# Patient Record
Sex: Male | Born: 1935 | Race: White | Hispanic: No | Smoking: Never smoker
Health system: Southern US, Community
[De-identification: ages and names within clinical notes are randomized; demographics above are authoritative.]

## PROBLEM LIST (undated history)

## (undated) DIAGNOSIS — L570 Actinic keratosis: Secondary | ICD-10-CM

## (undated) DIAGNOSIS — N400 Enlarged prostate without lower urinary tract symptoms: Secondary | ICD-10-CM

## (undated) DIAGNOSIS — I350 Nonrheumatic aortic (valve) stenosis: Secondary | ICD-10-CM

## (undated) DIAGNOSIS — H3530 Unspecified macular degeneration: Secondary | ICD-10-CM

## (undated) DIAGNOSIS — I340 Nonrheumatic mitral (valve) insufficiency: Secondary | ICD-10-CM

## (undated) DIAGNOSIS — I5022 Chronic systolic (congestive) heart failure: Secondary | ICD-10-CM

## (undated) DIAGNOSIS — I639 Cerebral infarction, unspecified: Secondary | ICD-10-CM

## (undated) DIAGNOSIS — I2510 Atherosclerotic heart disease of native coronary artery without angina pectoris: Secondary | ICD-10-CM

## (undated) DIAGNOSIS — I071 Rheumatic tricuspid insufficiency: Secondary | ICD-10-CM

## (undated) DIAGNOSIS — N1831 Chronic kidney disease, stage 3a: Secondary | ICD-10-CM

## (undated) DIAGNOSIS — I480 Paroxysmal atrial fibrillation: Secondary | ICD-10-CM

## (undated) DIAGNOSIS — I700 Atherosclerosis of aorta: Secondary | ICD-10-CM

## (undated) HISTORY — DX: Benign prostatic hyperplasia without lower urinary tract symptoms: N40.0

## (undated) HISTORY — DX: Unspecified macular degeneration: H35.30

## (undated) HISTORY — DX: Actinic keratosis: L57.0

## (undated) HISTORY — PX: SHOULDER SURGERY: SHX246

## (undated) HISTORY — DX: Cerebral infarction, unspecified: I63.9

## (undated) HISTORY — PX: KNEE SURGERY: SHX244

---

## 2018-03-16 ENCOUNTER — Ambulatory Visit (INDEPENDENT_AMBULATORY_CARE_PROVIDER_SITE_OTHER): Payer: Medicare Other | Admitting: Internal Medicine

## 2018-03-16 ENCOUNTER — Encounter (INDEPENDENT_AMBULATORY_CARE_PROVIDER_SITE_OTHER): Payer: Self-pay | Admitting: Internal Medicine

## 2018-03-16 VITALS — BP 140/90 | HR 71 | Temp 97.5°F | Ht 66.0 in | Wt 145.0 lb

## 2018-03-16 DIAGNOSIS — D7589 Other specified diseases of blood and blood-forming organs: Secondary | ICD-10-CM

## 2018-03-16 DIAGNOSIS — R030 Elevated blood-pressure reading, without diagnosis of hypertension: Secondary | ICD-10-CM

## 2018-03-16 DIAGNOSIS — Z0000 Encounter for general adult medical examination without abnormal findings: Secondary | ICD-10-CM

## 2018-03-16 DIAGNOSIS — K219 Gastro-esophageal reflux disease without esophagitis: Secondary | ICD-10-CM | POA: Insufficient documentation

## 2018-03-16 DIAGNOSIS — R351 Nocturia: Secondary | ICD-10-CM

## 2018-03-16 LAB — COMPREHENSIVE METABOLIC PANEL
ALT: 20 U/L (ref 0–55)
AST (SGOT): 22 U/L (ref 5–34)
Albumin/Globulin Ratio: 1.3 (ref 0.9–2.2)
Albumin: 3.8 g/dL (ref 3.5–5.0)
Alkaline Phosphatase: 139 U/L — ABNORMAL HIGH (ref 38–106)
BUN: 25 mg/dL (ref 9.0–28.0)
Bilirubin, Total: 0.6 mg/dL (ref 0.2–1.2)
CO2: 30 mEq/L — ABNORMAL HIGH (ref 21–29)
Calcium: 9.4 mg/dL (ref 7.9–10.2)
Chloride: 102 mEq/L (ref 100–111)
Creatinine: 1 mg/dL (ref 0.5–1.5)
Globulin: 3 g/dL (ref 2.0–3.7)
Glucose: 98 mg/dL (ref 70–100)
Potassium: 4.4 mEq/L (ref 3.5–5.1)
Protein, Total: 6.8 g/dL (ref 6.0–8.3)
Sodium: 142 mEq/L (ref 136–145)

## 2018-03-16 LAB — CBC AND DIFFERENTIAL
Absolute NRBC: 0 10*3/uL (ref 0.00–0.00)
Basophils Absolute Automated: 0.01 10*3/uL (ref 0.00–0.08)
Basophils Automated: 0.2 %
Eosinophils Absolute Automated: 0 10*3/uL (ref 0.00–0.44)
Eosinophils Automated: 0 %
Hematocrit: 39.6 % (ref 37.6–49.6)
Hgb: 12.6 g/dL (ref 12.5–17.1)
Immature Granulocytes Absolute: 0.02 10*3/uL (ref 0.00–0.07)
Immature Granulocytes: 0.5 %
Lymphocytes Absolute Automated: 0.91 10*3/uL (ref 0.42–3.22)
Lymphocytes Automated: 20.6 %
MCH: 33.9 pg — ABNORMAL HIGH (ref 25.1–33.5)
MCHC: 31.8 g/dL (ref 31.5–35.8)
MCV: 106.5 fL — ABNORMAL HIGH (ref 78.0–96.0)
MPV: 12.3 fL (ref 8.9–12.5)
Monocytes Absolute Automated: 0.4 10*3/uL (ref 0.21–0.85)
Monocytes: 9.1 %
Neutrophils Absolute: 3.07 10*3/uL (ref 1.10–6.33)
Neutrophils: 69.6 %
Nucleated RBC: 0 /100 WBC (ref 0.0–0.0)
Platelets: 118 10*3/uL — ABNORMAL LOW (ref 142–346)
RBC: 3.72 10*6/uL — ABNORMAL LOW (ref 4.20–5.90)
RDW: 14 % (ref 11–15)
WBC: 4.41 10*3/uL (ref 3.10–9.50)

## 2018-03-16 LAB — HEMOLYSIS INDEX: Hemolysis Index: 7 (ref 0–18)

## 2018-03-16 LAB — LIPID PANEL
Cholesterol / HDL Ratio: 4.3
Cholesterol: 167 mg/dL (ref 0–199)
HDL: 39 mg/dL — ABNORMAL LOW (ref 40–9999)
LDL Calculated: 99 mg/dL (ref 0–99)
Triglycerides: 144 mg/dL (ref 34–149)
VLDL Calculated: 29 mg/dL (ref 10–40)

## 2018-03-16 LAB — GFR: EGFR: >60

## 2018-03-16 MED ORDER — TAMSULOSIN HCL 0.4 MG PO CAPS
0.40 mg | ORAL_CAPSULE | Freq: Every day | ORAL | 1 refills | Status: DC
Start: 2018-03-16 — End: 2018-09-03

## 2018-03-16 NOTE — Progress Notes (Signed)
Subjective:      Patient ID: Mark Edwards is a 84 y.o. male.    Chief Complaint:  Chief Complaint   Patient presents with   . Annual Exam     Fasting for labs    . Referral     Dermatologist       HPI:  HPI     Pt presents for annual exam and to establish care.    Elevated BP- 160/78 today. Pt occasionally monitors home BP, reporting systolic readings in the 130s-140s. No CP or SOB.    GERD- Compliant with OTC Nexium 20 mg. Stable, no dysphagia    Hearing Loss- Does not want hearing aids.     Macular Degeneration- Blurry eyesight with glasses. Been evaluated by specialists, no specific rx recommended. Pt does not want to follow with ophtho anymore.     Pt reports regular exercise.    Nocturia- Wakes 2x per night to urinate. No urgency. +hx prostate surgery 20yrs ago, reportedly for BPH, no hx prostate cancer.     Lives with son, relocated to [place] from [place] 1 yr ago.     Problem List:  Patient Active Problem List   Diagnosis   . Gastroesophageal reflux disease without esophagitis       Current Medications:  Current Outpatient Medications   Medication Sig Dispense Refill   . esomeprazole (NEXIUM) 20 MG capsule Take 20 mg by mouth every morning before breakfast     . fexofenadine (ALLEGRA) 180 MG tablet Take 180 mg by mouth as needed     . tamsulosin (FLOMAX) 0.4 MG Cap Take 1 capsule (0.4 mg total) by mouth Daily after dinner 90 capsule 1     No current facility-administered medications for this visit.        Allergies:  No Known Allergies    Past Medical History:  No past medical history on file.    Past Surgical History:  Past Surgical History:   Procedure Laterality Date   . DENTAL SURGERY     . PROSTATE SURGERY  1980    for BPH,        Family History:  Family History   Problem Relation Age of Onset   . Asthma Father        Social History:  Social History     Socioeconomic History   . Marital status: Widowed     Spouse name: Not on file   . Number of children: 3   . Years of education: Not on file   .  Highest education level: Not on file   Occupational History   . Occupation: retired, Chief Executive Officer Needs   . Financial resource strain: Not on file   . Food insecurity:     Worry: Not on file     Inability: Not on file   . Transportation needs:     Medical: Not on file     Non-medical: Not on file   Tobacco Use   . Smoking status: Never Smoker   . Smokeless tobacco: Never Used   Substance and Sexual Activity   . Alcohol use: Yes     Alcohol/week: 1.0 standard drinks     Types: 1 Glasses of wine per week     Comment: Red Wine    . Drug use: Never   . Sexual activity: Not Currently   Lifestyle   . Physical activity:     Days per week: Not on file  Minutes per session: Not on file   . Stress: Not on file   Relationships   . Social connections:     Talks on phone: Not on file     Gets together: Not on file     Attends religious service: Not on file     Active member of club or organization: Not on file     Attends meetings of clubs or organizations: Not on file     Relationship status: Not on file   . Intimate partner violence:     Fear of current or ex partner: Not on file     Emotionally abused: Not on file     Physically abused: Not on file     Forced sexual activity: Not on file   Other Topics Concern   . Not on file   Social History Narrative   . Not on file       The following sections were reviewed this encounter by the provider:   Tobacco  Allergies  Meds  Problems  Surg Hx  Fam Hx  Soc Hx        ROS:  Review of Systems   Constitutional: Negative for activity change, appetite change, fever and unexpected weight change.   Eyes: Negative for visual disturbance.   Respiratory: Negative for shortness of breath.    Cardiovascular: Negative for chest pain and palpitations.   Gastrointestinal: Negative for abdominal pain, constipation, diarrhea, nausea and vomiting.   Genitourinary: Negative for difficulty urinating and urgency.        Nocturia   Musculoskeletal: Negative for myalgias.    Neurological: Negative for syncope, weakness, light-headedness and headaches.   Psychiatric/Behavioral: Negative for sleep disturbance.   All other systems reviewed and are negative.      Vitals:  BP 140/90   Pulse 71   Temp 97.5 F (36.4 C) (Oral)   Ht 1.676 m (5\' 6" )   Wt 65.8 kg (145 lb)   BMI 23.40 kg/m      Objective:     Physical Exam:  Physical Exam  Vitals signs reviewed.   Constitutional:       General: He is not in acute distress.     Appearance: Normal appearance. He is well-developed and normal weight.   HENT:      Head: Normocephalic.      Right Ear: Tympanic membrane and external ear normal.      Left Ear: Tympanic membrane and external ear normal.      Nose: Nose normal.      Mouth/Throat:      Mouth: Mucous membranes are moist.      Pharynx: Oropharynx is clear. No oropharyngeal exudate.   Eyes:      General: No scleral icterus.     Conjunctiva/sclera: Conjunctivae normal.      Pupils: Pupils are equal, round, and reactive to light.   Neck:      Musculoskeletal: Normal range of motion and neck supple.      Thyroid: No thyroid mass or thyromegaly.      Vascular: No carotid bruit or JVD.   Cardiovascular:      Rate and Rhythm: Normal rate and regular rhythm.      Pulses: Normal pulses.      Heart sounds: Normal heart sounds. No murmur. No friction rub. No gallop.    Pulmonary:      Effort: Pulmonary effort is normal. No respiratory distress.      Breath sounds: Normal breath sounds.  No wheezing, rhonchi or rales.   Abdominal:      General: Bowel sounds are normal. There is no distension.      Palpations: Abdomen is soft. There is no mass.      Tenderness: There is no tenderness. There is no guarding or rebound.      Hernia: There is no hernia in the right inguinal area or left inguinal area.   Genitourinary:     Penis: Normal and uncircumcised.       Scrotum/Testes: Normal.         Right: Mass or tenderness not present.         Left: Mass or tenderness not present.      Prostate: Not tender  and no nodules present.      Rectum: Normal.      Comments: S/p prostate surgery  Musculoskeletal: Normal range of motion.   Lymphadenopathy:      Cervical: No cervical adenopathy.      Upper Body:      Right upper body: No supraclavicular or axillary adenopathy.      Left upper body: No supraclavicular or axillary adenopathy.      Lower Body: No right inguinal adenopathy. No left inguinal adenopathy.   Skin:     General: Skin is warm and dry.      Findings: No rash.   Neurological:      Mental Status: He is alert and oriented to person, place, and time.      Cranial Nerves: No cranial nerve deficit.      Coordination: Coordination normal.      Deep Tendon Reflexes: Reflexes normal.   Psychiatric:         Mood and Affect: Mood normal.         Behavior: Behavior normal.          Assessment:     1. Annual physical exam  - CBC and differential  - Comprehensive metabolic panel  - Lipid panel    2. Gastroesophageal reflux disease without esophagitis    3. Elevated BP without diagnosis of hypertension  - Lipid panel    4. Nocturia  - PSA  - tamsulosin (FLOMAX) 0.4 MG Cap; Take 1 capsule (0.4 mg total) by mouth Daily after dinner  Dispense: 90 capsule; Refill: 1      Plan:     Continue current medications.  Start Flomax 0.4 mg.  Pt declines Influenza, Tdap, Pneumococcal vaccines today.  Blood work ordered. Further recommendations pending test results.   Monitor home BP.  F/u in 6 months.       By signing my name below, I, Gerrit Friends, Scribe, attest that this documentation has been prepared under the direction and in the presence of Myrtie Neither, MD.    I have reviewed the chart and agree that the record accurately reflects my personal performance of the history, physical exam, discussion and plan.    Excell Seltzer, MD  [facility], [facility]  [address]  [place], [place] [address]  [phone]

## 2018-03-16 NOTE — Progress Notes (Deleted)
Subjective:      Patient ID: Mark Edwards is a 84 y.o. male.    Chief Complaint:  No chief complaint on file.      HPI:  HPI     Pt presents for annual exam and to establish care.    Problem List:  There is no problem list on file for this patient.      Current Medications:  No current outpatient medications on file.     No current facility-administered medications for this visit.        Allergies:  Allergies not on file    Past Medical History:  No past medical history on file.    Past Surgical History:  No past surgical history on file.    Family History:  No family history on file.    Social History:  Social History     Socioeconomic History   . Marital status: Not on file     Spouse name: Not on file   . Number of children: Not on file   . Years of education: Not on file   . Highest education level: Not on file   Occupational History   . Not on file   Social Needs   . Financial resource strain: Not on file   . Food insecurity:     Worry: Not on file     Inability: Not on file   . Transportation needs:     Medical: Not on file     Non-medical: Not on file   Tobacco Use   . Smoking status: Not on file   Substance and Sexual Activity   . Alcohol use: Not on file   . Drug use: Not on file   . Sexual activity: Not on file   Lifestyle   . Physical activity:     Days per week: Not on file     Minutes per session: Not on file   . Stress: Not on file   Relationships   . Social connections:     Talks on phone: Not on file     Gets together: Not on file     Attends religious service: Not on file     Active member of club or organization: Not on file     Attends meetings of clubs or organizations: Not on file     Relationship status: Not on file   . Intimate partner violence:     Fear of current or ex partner: Not on file     Emotionally abused: Not on file     Physically abused: Not on file     Forced sexual activity: Not on file   Other Topics Concern   . Not on file   Social History Narrative   . Not on file        The following sections were reviewed this encounter by the provider:        ROS:  Review of Systems   Constitutional: Negative for activity change, appetite change, fever and unexpected weight change.   Eyes: Negative for visual disturbance.   Respiratory: Negative for shortness of breath.    Cardiovascular: Negative for chest pain and palpitations.   Gastrointestinal: Negative for abdominal pain, constipation, diarrhea, nausea and vomiting.   Genitourinary: Negative for difficulty urinating.   Musculoskeletal: Negative for myalgias.   Neurological: Negative for syncope, weakness, light-headedness and headaches.   Psychiatric/Behavioral: Negative for sleep disturbance.   All other systems reviewed and are negative.  Vitals:  There were no vitals taken for this visit.     Objective:     Physical Exam:  Physical Exam  Vitals signs reviewed.   Constitutional:       General: He is not in acute distress.     Appearance: Normal appearance. He is well-developed and normal weight.   HENT:      Head: Normocephalic.      Right Ear: Tympanic membrane and external ear normal.      Left Ear: Tympanic membrane and external ear normal.      Nose: Nose normal.      Mouth/Throat:      Mouth: Mucous membranes are moist.      Pharynx: Oropharynx is clear. No oropharyngeal exudate.   Eyes:      General: No scleral icterus.     Conjunctiva/sclera: Conjunctivae normal.      Pupils: Pupils are equal, round, and reactive to light.   Neck:      Musculoskeletal: Normal range of motion and neck supple.      Thyroid: No thyroid mass or thyromegaly.      Vascular: No carotid bruit or JVD.   Cardiovascular:      Rate and Rhythm: Normal rate and regular rhythm.      Pulses: Normal pulses.      Heart sounds: Normal heart sounds. No murmur. No friction rub. No gallop.    Pulmonary:      Effort: Pulmonary effort is normal. No respiratory distress.      Breath sounds: Normal breath sounds. No wheezing, rhonchi or rales.   Abdominal:       General: Bowel sounds are normal. There is no distension.      Palpations: Abdomen is soft. There is no mass.      Tenderness: There is no tenderness. There is no guarding or rebound.      Hernia: There is no hernia in the right inguinal area or left inguinal area.   Genitourinary:     Penis: Normal and circumcised.       Scrotum/Testes: Normal.         Right: Mass or tenderness not present.         Left: Mass or tenderness not present.      Rectum: Normal.   Musculoskeletal: Normal range of motion.   Lymphadenopathy:      Cervical: No cervical adenopathy.      Upper Body:      Right upper body: No supraclavicular adenopathy.      Left upper body: No supraclavicular adenopathy.      Lower Body: No right inguinal adenopathy. No left inguinal adenopathy.   Skin:     General: Skin is warm and dry.      Findings: No rash.   Neurological:      Mental Status: He is alert and oriented to person, place, and time.      Cranial Nerves: No cranial nerve deficit.      Coordination: Coordination normal.      Deep Tendon Reflexes: Reflexes normal.   Psychiatric:         Mood and Affect: Mood normal.         Behavior: Behavior normal.          Assessment:     There are no diagnoses linked to this encounter.    Plan:     Continue current medications. Refills given.   Blood work ordered. Further recommendations pending test results.  By signing my name below, I, Gerrit Friends, Scribe, attest that this documentation has been prepared under the direction and in the presence of Myrtie Neither, MD.    I have reviewed the chart and agree that the record accurately reflects my personal performance of the history, physical exam, discussion and plan.

## 2018-03-17 ENCOUNTER — Other Ambulatory Visit (INDEPENDENT_AMBULATORY_CARE_PROVIDER_SITE_OTHER): Payer: Self-pay | Admitting: Internal Medicine

## 2018-03-17 DIAGNOSIS — D7589 Other specified diseases of blood and blood-forming organs: Secondary | ICD-10-CM

## 2018-03-17 LAB — PSA: Prostate Specific Antigen, Total: 5.707 ng/mL — ABNORMAL HIGH (ref 0.000–4.000)

## 2018-03-19 LAB — FOLATE: Folate: 18.8 ng/mL

## 2018-03-19 LAB — VITAMIN B12: Vitamin B-12: 283 pg/mL (ref 211–911)

## 2018-03-20 LAB — IFE REVIEW, SERUM

## 2018-03-20 LAB — IMMUNOFIXATION ELECTROPHORESIS

## 2018-03-20 NOTE — Progress Notes (Signed)
PSA level is borderline high - given pt's age, will monitor.   Cholesterol profile HDL is borderline low, rest of profile is good  Platelet count is mildly low, unclear of chronicity, needs old labs to compare  Vitamin B12 is at low end of normal range  Alkaline phosphatase level is mildly high - needs old labs to compare  Rest of labs are normal    Rec:  1. Take otc vitamin B12 supplement every other day  2. Forward copies of previous blood tests for comparison  3.  Continue meds  4. Keep follow up visit

## 2018-04-02 ENCOUNTER — Other Ambulatory Visit (INDEPENDENT_AMBULATORY_CARE_PROVIDER_SITE_OTHER): Payer: Self-pay | Admitting: Internal Medicine

## 2018-04-02 DIAGNOSIS — L918 Other hypertrophic disorders of the skin: Secondary | ICD-10-CM

## 2018-04-02 NOTE — Progress Notes (Signed)
Pt can see derm, Dr. Kingsley Spittle Phone: [phone]

## 2018-09-03 ENCOUNTER — Other Ambulatory Visit (INDEPENDENT_AMBULATORY_CARE_PROVIDER_SITE_OTHER): Payer: Self-pay | Admitting: Internal Medicine

## 2018-09-03 DIAGNOSIS — R351 Nocturia: Secondary | ICD-10-CM

## 2018-09-03 MED ORDER — TAMSULOSIN HCL 0.4 MG PO CAPS
0.40 mg | ORAL_CAPSULE | Freq: Every day | ORAL | 0 refills | Status: DC
Start: 2018-09-03 — End: 2018-11-25

## 2018-11-25 ENCOUNTER — Other Ambulatory Visit (INDEPENDENT_AMBULATORY_CARE_PROVIDER_SITE_OTHER): Payer: Self-pay | Admitting: Internal Medicine

## 2018-11-25 DIAGNOSIS — R351 Nocturia: Secondary | ICD-10-CM

## 2018-11-25 MED ORDER — TAMSULOSIN HCL 0.4 MG PO CAPS
0.40 mg | ORAL_CAPSULE | Freq: Every day | ORAL | 0 refills | Status: AC
Start: 2018-11-25 — End: ?

## 2019-04-26 DIAGNOSIS — K219 Gastro-esophageal reflux disease without esophagitis: Secondary | ICD-10-CM | POA: Insufficient documentation

## 2019-04-26 DIAGNOSIS — Z9109 Other allergy status, other than to drugs and biological substances: Secondary | ICD-10-CM | POA: Insufficient documentation

## 2019-04-26 DIAGNOSIS — H353190 Nonexudative age-related macular degeneration, unspecified eye, stage unspecified: Secondary | ICD-10-CM | POA: Insufficient documentation

## 2019-04-26 DIAGNOSIS — N138 Other obstructive and reflux uropathy: Secondary | ICD-10-CM | POA: Insufficient documentation

## 2019-06-21 ENCOUNTER — Other Ambulatory Visit (INDEPENDENT_AMBULATORY_CARE_PROVIDER_SITE_OTHER): Payer: Self-pay

## 2019-08-14 ENCOUNTER — Ambulatory Visit: Payer: Self-pay

## 2019-08-21 ENCOUNTER — Ambulatory Visit: Payer: Self-pay | Attending: Internal Medicine

## 2019-08-21 DIAGNOSIS — Z23 Encounter for immunization: Secondary | ICD-10-CM

## 2019-08-21 NOTE — Progress Notes (Signed)
   Covid-19 Vaccination Clinic  Name:  Alejandro Moore    MRN: [id number] DOB: 03-31-1920  08/21/2019  Alejandro Moore was observed post Covid-19 immunization for 15 minutes without incident. He was provided with Vaccine Information Sheet and instruction to access the V-Safe system.   Alejandro Moore was instructed to call [phone] with any severe reactions post vaccine: Marland Kitchen Difficulty breathing  . Swelling of face and throat  . A fast heartbeat  . A bad rash all over body  . Dizziness and weakness   Immunizations Administered    Name Date Dose VIS Date Route   Pfizer COVID-19 Vaccine 08/21/2019  5:03 PM 0.3 mL 06/05/2018 Intramuscular   Manufacturer: [place]   Lot: [id number]   [place]: [id number]

## 2019-08-26 ENCOUNTER — Encounter: Payer: Self-pay | Admitting: Emergency Medicine

## 2019-08-26 ENCOUNTER — Emergency Department
Admission: EM | Admit: 2019-08-26 | Discharge: 2019-08-26 | Disposition: A | Discharge status: Home / Self Care | Payer: Medicare Other | Attending: Emergency Medicine | Admitting: Emergency Medicine

## 2019-08-26 ENCOUNTER — Other Ambulatory Visit: Payer: Self-pay

## 2019-08-26 ENCOUNTER — Emergency Department: Payer: Medicare Other

## 2019-08-26 DIAGNOSIS — R0602 Shortness of breath: Secondary | ICD-10-CM | POA: Diagnosis present

## 2019-08-26 DIAGNOSIS — R609 Edema, unspecified: Secondary | ICD-10-CM | POA: Diagnosis not present

## 2019-08-26 LAB — CBC WITH DIFFERENTIAL/PLATELET
Abs Immature Granulocytes: 0.03 10*3/uL (ref 0.00–0.07)
Basophils Absolute: 0 10*3/uL (ref 0.0–0.1)
Basophils Relative: 0 %
Eosinophils Absolute: 0 10*3/uL (ref 0.0–0.5)
Eosinophils Relative: 0 %
HCT: 35.6 % — ABNORMAL LOW (ref 39.0–52.0)
Hemoglobin: 11.6 g/dL — ABNORMAL LOW (ref 13.0–17.0)
Immature Granulocytes: 1 %
Lymphocytes Relative: 5 %
Lymphs Abs: 0.2 10*3/uL — ABNORMAL LOW (ref 0.7–4.0)
MCH: 34.7 pg — ABNORMAL HIGH (ref 26.0–34.0)
MCHC: 32.6 g/dL (ref 30.0–36.0)
MCV: 106.6 fL — ABNORMAL HIGH (ref 80.0–100.0)
Monocytes Absolute: 0.6 10*3/uL (ref 0.1–1.0)
Monocytes Relative: 14 %
Neutro Abs: 3.3 10*3/uL (ref 1.7–7.7)
Neutrophils Relative %: 80 %
Platelets: 104 10*3/uL — ABNORMAL LOW (ref 150–400)
RBC: 3.34 MIL/uL — ABNORMAL LOW (ref 4.22–5.81)
RDW: 14.8 % (ref 11.5–15.5)
WBC: 4.1 10*3/uL (ref 4.0–10.5)
nRBC: 0 % (ref 0.0–0.2)

## 2019-08-26 LAB — COMPREHENSIVE METABOLIC PANEL
ALT: 42 U/L (ref 0–44)
AST: 31 U/L (ref 15–41)
Albumin: 3.4 g/dL — ABNORMAL LOW (ref 3.5–5.0)
Alkaline Phosphatase: 99 U/L (ref 38–126)
Anion gap: 7 (ref 5–15)
BUN: 36 mg/dL — ABNORMAL HIGH (ref 8–23)
CO2: 26 mmol/L (ref 22–32)
Calcium: 8.6 mg/dL — ABNORMAL LOW (ref 8.9–10.3)
Chloride: 105 mmol/L (ref 98–111)
Creatinine, Ser: 1.27 mg/dL — ABNORMAL HIGH (ref 0.61–1.24)
GFR calc Af Amer: 54 mL/min — ABNORMAL LOW (ref >60)
GFR calc non Af Amer: 47 mL/min — ABNORMAL LOW (ref >60)
Glucose, Bld: 120 mg/dL — ABNORMAL HIGH (ref 70–99)
Potassium: 4.4 mmol/L (ref 3.5–5.1)
Sodium: 138 mmol/L (ref 135–145)
Total Bilirubin: 1 mg/dL (ref 0.3–1.2)
Total Protein: 6.2 g/dL — ABNORMAL LOW (ref 6.5–8.1)

## 2019-08-26 LAB — TROPONIN I (HIGH SENSITIVITY)
Troponin I (High Sensitivity): 99 ng/L — ABNORMAL HIGH (ref <18)
Troponin I (High Sensitivity): 106 ng/L (ref <18)

## 2019-08-26 LAB — BRAIN NATRIURETIC PEPTIDE: B Natriuretic Peptide: 1932.2 pg/mL — ABNORMAL HIGH (ref 0.0–100.0)

## 2019-08-26 MED ORDER — ACETAMINOPHEN 325 MG PO TABS
650.0000 mg | ORAL_TABLET | Freq: Once | ORAL | Status: AC | PRN
  Administered 2019-08-26: 650 mg via ORAL
  Filled 2019-08-26: qty 2

## 2019-08-26 MED ORDER — FUROSEMIDE 40 MG PO TABS
20.0000 mg | ORAL_TABLET | Freq: Once | ORAL | Status: AC
  Administered 2019-08-26: 20 mg via ORAL
  Filled 2019-08-26: qty 1

## 2019-08-26 MED ORDER — AMOXICILLIN-POT CLAVULANATE 875-125 MG PO TABS
1.0000 | ORAL_TABLET | Freq: Once | ORAL | Status: AC
  Administered 2019-08-26: 1 via ORAL
  Filled 2019-08-26: qty 1

## 2019-08-26 MED ORDER — ONDANSETRON 4 MG PO TBDP
4.0000 mg | ORAL_TABLET | Freq: Three times a day (TID) | ORAL | 0 refills | Status: DC | PRN
Start: 2019-08-26 — End: 2019-09-06

## 2019-08-26 MED ORDER — AMOXICILLIN-POT CLAVULANATE 875-125 MG PO TABS
1.0000 | ORAL_TABLET | Freq: Two times a day (BID) | ORAL | 0 refills | Status: DC
Start: 2019-08-26 — End: 2020-07-25

## 2019-08-26 NOTE — ED Provider Notes (Signed)
[facility] Emergency Department Provider Note  ____________________________________________  Time seen: Approximately 10:08 PM  I have reviewed the triage vital signs and the nursing notes.   HISTORY  Chief Complaint Shortness of breath   HPI Alejandro Moore is a 86 y.o. male with no significant past medical history who comes to the ED today with concerns about medications he was recently prescribed.  At baseline the patient is healthy and active, tap dances for an hour every day.  5 days ago, he got his first Covid vaccine.  Shortly thereafter he started having nasal congestion, postnasal drip with nonproductive cough, and mild shortness of breath.  He saw his doctor 3 days ago.  They prescribed Flonase and Levaquin.  After picking up the Levaquin, he is hesitant to take it with concerns for connective tissue injury.  He went back to primary care today where they reassured him that it would be fine to take the Levaquin.  They also prescribed him erythromycin ointment for a hordeolum.  He is still hesitant to take the Levaquin tonight, but the primary care clinic is closed, so he comes to the ED to inquire about other options.  Denies fevers chills chest pain exertional symptoms, pleuritic discomfort, belly pain vomiting diarrhea or body aches.  No Covid exposures, is strict about staying home and wearing masks.      History reviewed. No pertinent past medical history.   There are no problems to display for this patient.    Past Surgical History:  Procedure Laterality Date  . SHOULDER SURGERY       Prior to Admission medications   Medication Sig Start Date End Date Taking? Authorizing Provider  amoxicillin-clavulanate (AUGMENTIN) 875-125 MG tablet Take 1 tablet by mouth 2 (two) times daily. 08/26/19   Carrie Mew, MD  ondansetron (ZOFRAN ODT) 4 MG disintegrating tablet Take 1 tablet (4 mg total) by mouth every 8 (eight) hours as needed for  nausea or vomiting. 08/26/19   Carrie Mew, MD     Allergies Patient has no known allergies.   No family history on file.  Social History Social History   Tobacco Use  . Smoking status: Never Smoker  . Smokeless tobacco: Never Used  Substance Use Topics  . Alcohol use: Never  . Drug use: Never    Review of Systems  Constitutional:   No fever or chills.  ENT:   No sore throat. No rhinorrhea. Cardiovascular:   No chest pain or syncope. Respiratory:   Positive shortness of breath and nonproductive cough. Gastrointestinal:   Negative for abdominal pain, vomiting and diarrhea.  Musculoskeletal:   Negative for focal pain or swelling All other systems reviewed and are negative except as documented above in ROS and HPI.  ____________________________________________   PHYSICAL EXAM:  VITAL SIGNS: ED Triage Vitals  Enc Vitals Group     BP 08/26/19 1922 100/64     Pulse Rate 08/26/19 1922 100     Resp 08/26/19 1922 20     Temp 08/26/19 1922 (!) 100.9 F (38.3 C)     Temp Source 08/26/19 1922 Oral     SpO2 08/26/19 1922 94 %     Weight 08/26/19 1929 145 lb (65.8 kg)     Height 08/26/19 1929 5\' 6"  (1.676 m)     Head Circumference --      Peak Flow --      Pain Score 08/26/19 1923 8     Pain Loc --  Pain Edu? --      Excl. in [place]? --     Vital signs reviewed, nursing assessments reviewed.   Constitutional:   Alert and oriented. Non-toxic appearance. Eyes:   Conjunctivae are normal. EOMI. PERRL. ENT      Head:   Normocephalic and atraumatic.      Nose:   Wearing a mask.      Mouth/Throat:   Wearing a mask.      Neck:   No meningismus. Full ROM. Hematological/Lymphatic/Immunilogical:   No cervical lymphadenopathy. Cardiovascular:   RRR. Symmetric bilateral radial and DP pulses.  No murmurs. Cap refill less than 2 seconds. Respiratory:   Normal respiratory effort without tachypnea/retractions.  Bilateral basilar crackles. Gastrointestinal:   Soft and  nontender. Non distended. There is no CVA tenderness.  No rebound, rigidity, or guarding. Musculoskeletal:   Normal range of motion in all extremities. No joint effusions.  No lower extremity tenderness.  1+ pitting edema bilateral lower extremities, symmetric calf circumference.. Neurologic:   Normal speech and language.  Motor grossly intact. No acute focal neurologic deficits are appreciated.  Skin:    Skin is warm, dry and intact. No rash noted.  No petechiae, purpura, or bullae.  ____________________________________________    LABS (pertinent positives/negatives) (all labs ordered are listed, but only abnormal results are displayed) Labs Reviewed  CBC WITH DIFFERENTIAL/PLATELET - Abnormal; Notable for the following components:      Result Value   RBC 3.34 (*)    Hemoglobin 11.6 (*)    HCT 35.6 (*)    MCV 106.6 (*)    MCH 34.7 (*)    Platelets 104 (*)    Lymphs Abs 0.2 (*)    All other components within normal limits  COMPREHENSIVE METABOLIC PANEL - Abnormal; Notable for the following components:   Glucose, Bld 120 (*)    BUN 36 (*)    Creatinine, Ser 1.27 (*)    Calcium 8.6 (*)    Total Protein 6.2 (*)    Albumin 3.4 (*)    GFR calc non Af Amer 47 (*)    GFR calc Af Amer 54 (*)    All other components within normal limits  BRAIN NATRIURETIC PEPTIDE - Abnormal; Notable for the following components:   B Natriuretic Peptide 1,932.2 (*)    All other components within normal limits  TROPONIN I (HIGH SENSITIVITY) - Abnormal; Notable for the following components:   Troponin I (High Sensitivity) 99 (*)    All other components within normal limits  TROPONIN I (HIGH SENSITIVITY) - Abnormal; Notable for the following components:   Troponin I (High Sensitivity) 106 (*)    All other components within normal limits   ____________________________________________   EKG  Interpreted by me Sinus rhythm rate of 100.  Normal axis and intervals.  Normal QRS and ST segments.   Unremarkable T waves.  2 PVCs on the strip.  No acute ischemic changes. ____________________________________________    RADIOLOGY  DG Chest 2 View  Result Date: 08/26/2019 CLINICAL DATA:  Cough, chest pain, shortness of breath EXAM: CHEST - 2 VIEW COMPARISON:  None. FINDINGS: Small bilateral pleural effusions. Bibasilar atelectasis or scarring. Heart is normal size. Aortic atherosclerosis. No acute bony abnormality. IMPRESSION: Small bilateral pleural effusions with bibasilar atelectasis or scarring. Electronically Signed   By: Rolm Baptise M.D.   On: 08/26/2019 20:17    ____________________________________________   PROCEDURES Procedures  ____________________________________________  DIFFERENTIAL DIAGNOSIS   Vaccine side effect, allergies, non-STEMI, congestive heart failure,  community-acquired pneumonia  CLINICAL IMPRESSION / ASSESSMENT AND PLAN / ED COURSE  Medications ordered in the ED: Medications  acetaminophen (TYLENOL) tablet 650 mg (650 mg Oral Given 08/26/19 1945)  amoxicillin-clavulanate (AUGMENTIN) 875-125 MG per tablet 1 tablet (1 tablet Oral Given 08/26/19 2220)  furosemide (LASIX) tablet 20 mg (20 mg Oral Given 08/26/19 2254)    Pertinent labs & imaging results that were available during my care of the patient were reviewed by me and considered in my medical decision making (see chart for details).  Alejandro Moore was evaluated in Emergency Department on 08/26/2019 for the symptoms described in the history of present illness. He was evaluated in the context of the global COVID-19 pandemic, which necessitated consideration that the patient might be at risk for infection with the SARS-CoV-2 virus that causes COVID-19. Institutional protocols and algorithms that pertain to the evaluation of patients at risk for COVID-19 are in a state of rapid change based on information released by regulatory bodies including the CDC and federal and state organizations. These policies  and algorithms were followed during the patient's care in the ED.     Clinical Course as of Aug 26 2254  Mon Aug 26, 2019  2121 Labs show baseline creatinine and hb from Jan. 2021. Lymphocytopenia appears new. No prior troponin available for comparison.    [PS]  2147 Patient comes to the ED essentially for medication change.  He feels only mildly short of breath, denies chest pain or exertional symptoms.  Had a first Covid vaccine 5 days ago.  Would not of come to the ED today except the the outpatient clinic prescribed him Levaquin and they are worried about side effects and wanted to, after hours to change to a different antibiotic.  He is nontoxic and not septic.   [PS]  2252 Delta troponin okay, but with his troponins of about 100 and his BNP of 1900, worrisome that his shortness of breath is actually due to new onset of heart failure.  He does have 1+ pitting edema bilateral lower extremities.  Focused bedside point-of-care ultrasound performed by me shows no pericardial effusion, EF grossly reduced, probably about 40%.  He is not hypoxic and overall feeling fine.  Offered hospitalization for further cardiac work-up and cardiology consultation, which she declines.  I will refer to heart failure clinic and cardiology.  I will give a single dose of Lasix for now.   [PS]    Clinical Course User Index [PS] Carrie Mew, MD     ____________________________________________   FINAL CLINICAL IMPRESSION(S) / ED DIAGNOSES    Final diagnoses:  Shortness of breath  Peripheral edema     ED Discharge Orders         Ordered    amoxicillin-clavulanate (AUGMENTIN) 875-125 MG tablet  2 times daily     08/26/19 2133    ondansetron (ZOFRAN ODT) 4 MG disintegrating tablet  Every 8 hours PRN     08/26/19 2133          Portions of this note were generated with dragon dictation software. Dictation errors may occur despite best attempts at proofreading.   Carrie Mew,  MD 08/26/19 2256

## 2019-08-26 NOTE — ED Triage Notes (Signed)
Pt presents to ED with concerns about a medicaiton he was prescribed today  Pt states he was seen by his pcp today for chest and back discomfort and he was told he had fluid in his left lung and was started on a medication to get rid of that fluid. Pt states after reading the side effects he decided not to take it. Pt reports it said people over a certain age shouldn't use it so he just wanted to make sure it was safe. Pt reports he has been experiencing right sided back and mid sternal chest pain since yesterday. "some" shortness of breath but denies cough. Pt alert and calm with no acute distress noted at this time.

## 2019-08-27 ENCOUNTER — Telehealth: Payer: Self-pay | Admitting: Family

## 2019-08-27 NOTE — Telephone Encounter (Signed)
Called and LVM to attempt to schedule a new patient appointment after we receieved a referral from the ED.   Alyse Low, [place]

## 2019-08-29 NOTE — Progress Notes (Signed)
Patient ID: Alejandro Moore, male    DOB: 06-Nov-1919, 86 y.o.   MRN: [id number]  HPI  Alejandro Moore is a 86 y/o male with no pertinent medical history.  No echo to review.  Was in the ED 08/26/19 due to wanting antibiotic changed from levaquin to something else. EDP noted patient had shortness of breath and peripheral edema along with elevated BNP. Point of care ultrasound done which showed no effusion and EF of around 40%. Patient feels well and declines admission. He was given a dose of lasix and released.   He presents today for his initial visit with a chief complaint of minimal shortness of breath upon moderate exertion. He describes this as having been present for several weeks and may be "a little" better since he's been in the ED. He has associated fatigue, productive cough and difficulty sleeping along with this. He denies any dizziness, abdominal distention, palpitations, pedal edema or chest pain. He also has some pain around his rib cage when he coughs.   He has been quite active and tap dances often.   No past medical history on file. Past Surgical History:  Procedure Laterality Date  . SHOULDER SURGERY     No family history on file. Social History   Tobacco Use  . Smoking status: Never Smoker  . Smokeless tobacco: Never Used  Substance Use Topics  . Alcohol use: Never   No Known Allergies   Prior to Admission medications   Medication Sig Start Date End Date Taking? Authorizing Provider  acetaminophen (TYLENOL) 500 MG tablet Take 500 mg by mouth every 6 (six) hours as needed.   Yes [provider]  amoxicillin-clavulanate (AUGMENTIN) 875-125 MG tablet Take 1 tablet by mouth 2 (two) times daily. 08/26/19  Yes Carrie Mew, MD  esomeprazole (NEXIUM) 20 MG capsule Take 20 mg by mouth daily.   Yes [provider]  fexofenadine (ALLEGRA) 60 MG tablet Take 180 mg by mouth daily.   Yes [provider]  fluticasone (FLONASE) 50 MCG/ACT nasal  spray Place 2 sprays into both nostrils daily.   Yes [provider]  gentamicin ointment (GARAMYCIN) 0.1 % Apply 1 application topically 3 (three) times daily. 08/26/19 09/05/19 Yes [provider]  Multiple Vitamins-Minerals (ICAPS AREDS 2 PO) Take 1 tablet by mouth 2 (two) times daily.   Yes [provider]  Multiple Vitamins-Minerals (THERAPEUTIC MULTIVIT/MINERAL PO) Take 1 tablet by mouth daily.   Yes [provider]  tamsulosin (FLOMAX) 0.4 MG CAPS capsule Take 0.4 mg by mouth daily. 04/26/19  Yes [provider]  ondansetron (ZOFRAN ODT) 4 MG disintegrating tablet Take 1 tablet (4 mg total) by mouth every 8 (eight) hours as needed for nausea or vomiting. Patient not taking: Reported on 08/30/2019 08/26/19   Carrie Mew, MD     Review of Systems  Constitutional: Positive for fatigue. Negative for appetite change.  HENT: Negative for congestion, postnasal drip and sore throat.   Eyes: Negative.   Respiratory: Positive for cough (productive) and shortness of breath.   Cardiovascular: Positive for leg swelling. Negative for chest pain and palpitations.  Gastrointestinal: Negative for abdominal distention and abdominal pain.  Endocrine: Negative.   Genitourinary: Negative.   Musculoskeletal: Positive for arthralgias (when deep breathing). Negative for neck pain.  Allergic/Immunologic: Negative.   Neurological: Negative for dizziness and light-headedness.  Hematological: Negative for adenopathy. Does not bruise/bleed easily.  Psychiatric/Behavioral: Positive for sleep disturbance (didn't sleep well last 2 nights). Negative for dysphoric mood.  The patient is not nervous/anxious.     Vitals:   08/30/19 1202  BP: 123/74  Pulse: 87  Resp: 18  SpO2: 98%  Weight: 137 lb (62.1 kg)  Height: 5\' 6"  (1.676 m)   Wt Readings from Last 3 Encounters:  08/30/19 137 lb (62.1 kg)  08/26/19 145 lb (65.8 kg)   Lab Results  Component Value Date    CREATININE 1.27 (H) 08/26/2019    Physical Exam Vitals and nursing note reviewed.  Constitutional:      Appearance: He is well-developed.  HENT:     Head: Normocephalic and atraumatic.  Neck:     Vascular: No JVD.  Cardiovascular:     Rate and Rhythm: Normal rate and regular rhythm.  Pulmonary:     Effort: Pulmonary effort is normal. No respiratory distress.     Breath sounds: No wheezing or rales.  Abdominal:     Palpations: Abdomen is soft.     Tenderness: There is no abdominal tenderness.  Musculoskeletal:     Cervical back: Neck supple.     Right lower leg: No tenderness. Edema (1+ pitting) present.     Left lower leg: No tenderness. Edema (1+ pitting) present.  Skin:    General: Skin is warm and dry.  Neurological:     General: No focal deficit present.     Mental Status: He is alert and oriented to person, place, and time.  Psychiatric:        Mood and Affect: Mood normal.        Behavior: Behavior normal.     Assessment & Plan:  1: Heart failure with unknown ejection fraction- - NYHA II - euvolemic today - not weighing daily and daughter says that she will get him a scale; instructed to weigh every morning, write the weight down and call for an overnight weight gain of >2 pounds or a weekly weight gain of >5 pounds - not adding salt and says that he tries to eat low sodium in general; written dietary information and a low sodium cookbook were given to him - is quite active with tap dancing - need to order echocardiogram; discussed ordering it or making appointment with cardiology & let them order it - he would like cardiology appt and since his PCP is at [facility], he prefers to go there; appointment was scheduled for 09/13/19 with first available provider - will begin furosemide 20mg  daily and potassium 74meq daily; cardiology can check BMP - BNP 08/26/19 was 1932.2 - patient has macular degeneration making it difficult for him to see the edema; showed him how to press on  his legs and then feel for the indent - discussed getting compression socks and wearing them daily along with elevating his legs when sitting for long periods of time - PharmD reconciled medications with the patient and his daughter - has received both COVID vaccines  2: GERD- - saw PCP Deloria Lair) 08/26/19 and sees PCP ([facility]) 10/28/19 - BMP reviewed and showed sodium 138, potassium 4.4, creatinine 1.27 & GFR 47   Medication bottles were reviewed.   Patient opts to not make a return appointment at this time and prefers to see cardiologist first. Advised patient and his daughter that he could call back at anytime to make another appointment.

## 2019-08-30 ENCOUNTER — Ambulatory Visit: Payer: Medicare Other | Attending: Family | Admitting: Family

## 2019-08-30 ENCOUNTER — Encounter: Payer: Self-pay | Admitting: Family

## 2019-08-30 ENCOUNTER — Other Ambulatory Visit: Payer: Self-pay

## 2019-08-30 VITALS — BP 123/74 | HR 87 | Resp 18 | Ht 66.0 in | Wt 137.0 lb

## 2019-08-30 DIAGNOSIS — G479 Sleep disorder, unspecified: Secondary | ICD-10-CM | POA: Diagnosis not present

## 2019-08-30 DIAGNOSIS — H3530 Unspecified macular degeneration: Secondary | ICD-10-CM | POA: Diagnosis not present

## 2019-08-30 DIAGNOSIS — Z79899 Other long term (current) drug therapy: Secondary | ICD-10-CM | POA: Diagnosis not present

## 2019-08-30 DIAGNOSIS — K219 Gastro-esophageal reflux disease without esophagitis: Secondary | ICD-10-CM | POA: Diagnosis not present

## 2019-08-30 DIAGNOSIS — I509 Heart failure, unspecified: Secondary | ICD-10-CM | POA: Insufficient documentation

## 2019-08-30 MED ORDER — FUROSEMIDE 20 MG PO TABS
20.0000 mg | ORAL_TABLET | Freq: Every day | ORAL | 3 refills | Status: DC
Start: 2019-08-30 — End: 2019-11-24

## 2019-08-30 MED ORDER — POTASSIUM CHLORIDE ER 10 MEQ PO TBCR
10.0000 meq | EXTENDED_RELEASE_TABLET | Freq: Every day | ORAL | 3 refills | Status: DC
Start: 2019-08-30 — End: 2019-11-24

## 2019-08-30 NOTE — Progress Notes (Signed)
[facility] - PHARMACIST COUNSELING NOTE  ADHERENCE ASSESSMENT  Adherence strategy: Patient accompanied by daughter who helps with medications. Medication bottles brought to visit today.   Do you ever forget to take your medication? [] Yes (1) [x] No (0)  Do you ever skip doses due to side effects? [] Yes (1) [x] No (0)  Do you have trouble affording your medicines? [] Yes (1) [x] No (0)  Are you ever unable to pick up your medication due to transportation difficulties? [] Yes (1) [x] No (0)  Do you ever stop taking your medications because you don't believe they are helping? [] Yes (1) [x] No (0)  Total score 0   Recommendations given to patient about increasing adherence: None  Guideline-Directed Medical Therapy/Evidence Based Medicine  ACE/ARB/ARNI: None Beta Blocker: None Aldosterone Antagonist: None Diuretic: None    SUBJECTIVE  HPI: Patient is a 86 y/o M with no significant PMH who presents to CHF clinic for new patient evaluation. He was recently seen in the ED on 5/17 for shortness of breath. Incidentally found to have a BNP of 1932 at that time with troponin 106. He was given furosemide 20 mg PO x 1.  No past medical history on file.     OBJECTIVE   Vital signs: HR 87, BP 123/74, weight (pounds) 137 ECHO: Future  BMP Latest Ref Rng & Units 08/26/2019  Glucose 70 - 99 mg/dL 120(H)  BUN 8 - 23 mg/dL 36(H)  Creatinine 0.61 - 1.24 mg/dL 1.27(H)  Sodium 135 - 145 mmol/L 138  Potassium 3.5 - 5.1 mmol/L 4.4  Chloride 98 - 111 mmol/L 105  CO2 22 - 32 mmol/L 26  Calcium 8.9 - 10.3 mg/dL 8.6(L)    ASSESSMENT  Patient is a pleasant 86 y/o M who is accompanied by daughter today. Daughter is closely involved in patient's care. Patient endorses shortness of breath with moderate exertion. Edema present in bilateral lower extremities. Patient also reports pain associated with deep breaths which interferes with sleep.  PLAN  1).  Possible CHF -BNP 1932 on 08/26/19 -Not on any medications at home -Discussed with provider - start furosemide 20 mg daily + potassium 10 mEq daily  2). Anemia -Hgb 11.6, MCV 106 on 08/26/19   Time spent: 15 minutes  [facility] Resident 08/30/2019 9:55 AM    Current Outpatient Medications:  .  amoxicillin-clavulanate (AUGMENTIN) 875-125 MG tablet, Take 1 tablet by mouth 2 (two) times daily., Disp: 20 tablet, Rfl: 0 .  ondansetron (ZOFRAN ODT) 4 MG disintegrating tablet, Take 1 tablet (4 mg total) by mouth every 8 (eight) hours as needed for nausea or vomiting., Disp: 20 tablet, Rfl: 0   COUNSELING POINTS/CLINICAL PEARLS  Furosemide  Drug causes sun-sensitivity. Advise patient to use sunscreen and avoid tanning beds. Patient should avoid activities requiring coordination until drug effects are realized, as drug may cause dizziness, vertigo, or blurred vision. This drug may cause hyperglycemia, hyperuricemia, constipation, diarrhea, loss of appetite, nausea, vomiting, purpuric disorder, cramps, spasticity, asthenia, headache, paresthesia, or scaling eczema. Instruct patient to report unusual bleeding/bruising or signs/symptoms of hypotension, infection, pancreatitis, or ototoxicity (tinnitus, hearing impairment). Advise patient to report signs/symptoms of a severe skin reactions (flu-like symptoms, spreading red rash, or skin/mucous membrane blistering) or erythema multiforme. Instruct patient to eat high-potassium foods during drug therapy, as directed by healthcare professional.  Patient should not drink alcohol while taking this drug.  DRUGS TO AVOID IN HEART FAILURE  Drug or Class Mechanism  Analgesics . NSAIDs . COX-2  inhibitors . Glucocorticoids  Sodium and water retention, increased systemic vascular resistance, decreased response to diuretics   Diabetes Medications . Metformin . Thiazolidinediones o Rosiglitazone (Avandia) o Pioglitazone (Actos) . DPP4  Inhibitors o Saxagliptin (Onglyza) o Sitagliptin (Januvia)   Lactic acidosis Possible calcium channel blockade   Unknown  Antiarrhythmics . Class I  o Flecainide o Disopyramide . Class III o Sotalol . Other o Dronedarone  Negative inotrope, proarrhythmic   Proarrhythmic, beta blockade  Negative inotrope  Antihypertensives . Alpha Blockers o Doxazosin . Calcium Channel Blockers o Diltiazem o Verapamil o Nifedipine . Central Alpha Adrenergics o Moxonidine . Peripheral Vasodilators o Minoxidil  Increases renin and aldosterone  Negative inotrope    Possible sympathetic withdrawal  Unknown  Anti-infective . Itraconazole . Amphotericin B  Negative inotrope Unknown  Hematologic . Anagrelide . Cilostazol   Possible inhibition of PD IV Inhibition of PD III causing arrhythmias  Neurologic/Psychiatric . Stimulants . Anti-Seizure Drugs o Carbamazepine o Pregabalin . Antidepressants o Tricyclics o Citalopram . Parkinsons o Bromocriptine o Pergolide o Pramipexole . Antipsychotics o Clozapine . Antimigraine o Ergotamine o Methysergide . Appetite suppressants . Bipolar o Lithium  Peripheral alpha and beta agonist activity  Negative inotrope and chronotrope Calcium channel blockade  Negative inotrope, proarrhythmic Dose-dependent QT prolongation  Excessive serotonin activity/valvular damage Excessive serotonin activity/valvular damage Unknown  IgE mediated hypersensitivy, calcium channel blockade  Excessive serotonin activity/valvular damage Excessive serotonin activity/valvular damage Valvular damage  Direct myofibrillar degeneration, adrenergic stimulation  Antimalarials . Chloroquine . Hydroxychloroquine Intracellular inhibition of lysosomal enzymes  Urologic Agents . Alpha Blockers o Doxazosin o Prazosin o Tamsulosin o Terazosin  Increased renin and aldosterone  Adapted from Page RL, et al. "Drugs That May Cause or Exacerbate  Heart Failure: A Scientific Statement from the [employer]." Circulation 2016; [id number]. DOI: 10.1161/CIR.0000000000000426   MEDICATION ADHERENCES TIPS AND STRATEGIES 1. Taking medication as prescribed improves patient outcomes in heart failure (reduces hospitalizations, improves symptoms, increases survival) 2. Side effects of medications can be managed by decreasing doses, switching agents, stopping drugs, or adding additional therapy. Please let someone in the [facility] know if you have having bothersome side effects so we can modify your regimen. Do not alter your medication regimen without talking to [place].  3. Medication reminders can help patients remember to take drugs on time. If you are missing or forgetting doses you can try linking behaviors, using pill boxes, or an electronic reminder like an alarm on your phone or an app. Some people can also get automated phone calls as medication reminders.

## 2019-08-30 NOTE — Patient Instructions (Addendum)
Continue weighing daily and call for an overnight weight gain of > 2 pounds or a weekly weight gain of >5 pounds.   Dr. [facility] 09/13/19 at [place], [place], [place] [id number] [phone]

## 2019-09-06 ENCOUNTER — Other Ambulatory Visit: Payer: Self-pay

## 2019-09-06 ENCOUNTER — Ambulatory Visit: Payer: Medicare Other | Attending: Family | Admitting: Family

## 2019-09-06 ENCOUNTER — Encounter: Payer: Self-pay | Admitting: Family

## 2019-09-06 VITALS — BP 115/60 | HR 87 | Resp 16 | Ht 66.0 in | Wt 134.4 lb

## 2019-09-06 DIAGNOSIS — I509 Heart failure, unspecified: Secondary | ICD-10-CM | POA: Diagnosis not present

## 2019-09-06 DIAGNOSIS — Z79899 Other long term (current) drug therapy: Secondary | ICD-10-CM | POA: Diagnosis not present

## 2019-09-06 DIAGNOSIS — I4891 Unspecified atrial fibrillation: Secondary | ICD-10-CM | POA: Diagnosis present

## 2019-09-06 DIAGNOSIS — I5021 Acute systolic (congestive) heart failure: Secondary | ICD-10-CM | POA: Insufficient documentation

## 2019-09-06 DIAGNOSIS — K219 Gastro-esophageal reflux disease without esophagitis: Secondary | ICD-10-CM | POA: Insufficient documentation

## 2019-09-06 DIAGNOSIS — N183 Chronic kidney disease, stage 3 unspecified: Secondary | ICD-10-CM | POA: Insufficient documentation

## 2019-09-06 NOTE — Progress Notes (Signed)
Patient ID: Alejandro Moore, male    DOB: 08/29/1919, 86 y.o.   MRN: [id number]  HPI  Mr Kuhnke is a 86 y/o male with no pertinent medical history.  No echo to review.  Was in the ED 08/26/19 due to wanting antibiotic changed from levaquin to something else. EDP noted patient had shortness of breath and peripheral edema along with elevated BNP. Point of care ultrasound done which showed no effusion and EF of around 40%. Patient feels well and declines admission. He was given a dose of lasix and released.   He presents today for an acute visit with a chief complaint of chest pressure across his mid-chest. He describes this as chronic in nature having been present for several days. Was previously intermittent. He has associated decreased appetite, chest tightness, cough, shortness of breath, pedal edema and difficulty sleeping along with this. He denies any dizziness, abdominal distention, palpitations or weight gain.   Daughter that is present with him says that patient has decreased appetite and isn't eating anything and that he sits around all day because he doesn't feel good. Currently has NP cardiology appointment scheduled at [facility] on June 4th.   No past medical history on file. Past Surgical History:  Procedure Laterality Date  . SHOULDER SURGERY     No family history on file. Social History   Tobacco Use  . Smoking status: Never Smoker  . Smokeless tobacco: Never Used  Substance Use Topics  . Alcohol use: Never   No Known Allergies   Prior to Admission medications   Medication Sig Start Date End Date Taking? Authorizing Provider  esomeprazole (NEXIUM) 20 MG capsule Take 20 mg by mouth daily.   Yes [provider]  fluticasone (FLONASE) 50 MCG/ACT nasal spray Place 2 sprays into both nostrils daily.   Yes [provider]  furosemide (LASIX) 20 MG tablet Take 1 tablet (20 mg total) by mouth daily. 08/30/19 11/28/19 Yes Alyssandra Hulsebus, Aura Fey, FNP   Multiple Vitamins-Minerals (ICAPS AREDS 2 PO) Take 1 tablet by mouth 2 (two) times daily.   Yes [provider]  Multiple Vitamins-Minerals (THERAPEUTIC MULTIVIT/MINERAL PO) Take 1 tablet by mouth daily.   Yes [provider]  potassium chloride (KLOR-CON) 10 MEQ tablet Take 1 tablet (10 mEq total) by mouth daily. 08/30/19 11/28/19 Yes Christyl Osentoski, Otila Kluver A, FNP  tamsulosin (FLOMAX) 0.4 MG CAPS capsule Take 0.4 mg by mouth daily. 04/26/19  Yes [provider]  acetaminophen (TYLENOL) 500 MG tablet Take 500 mg by mouth every 6 (six) hours as needed.    [provider]  amoxicillin-clavulanate (AUGMENTIN) 875-125 MG tablet Take 1 tablet by mouth 2 (two) times daily. 08/26/19   Carrie Mew, MD  fexofenadine (ALLEGRA) 60 MG tablet Take 180 mg by mouth daily.    [provider]    Review of Systems  Constitutional: Positive for appetite change (decreased) and fatigue.  HENT: Negative for congestion, postnasal drip and sore throat.   Eyes: Negative.   Respiratory: Positive for cough (productive), chest tightness and shortness of breath.   Cardiovascular: Positive for chest pain ("pressure all the time") and leg swelling. Negative for palpitations.  Gastrointestinal: Negative for abdominal distention and abdominal pain.  Endocrine: Negative.   Genitourinary: Negative.   Musculoskeletal: Positive for arthralgias (when deep breathing). Negative for neck pain.  Allergic/Immunologic: Negative.   Neurological: Negative for dizziness and light-headedness.  Hematological: Negative for adenopathy. Does not bruise/bleed easily.  Psychiatric/Behavioral: Positive for sleep disturbance (not sleeping  well). Negative for dysphoric mood. The patient is not nervous/anxious.    Vitals:   09/06/19 1105  BP: 115/60  Pulse: 87  Resp: 16  SpO2: 96%  Weight: 134 lb 6 oz (61 kg)  Height: 5\' 6"  (1.676 m)   Wt Readings from Last 3 Encounters:  09/06/19 134 lb 6 oz (61 kg)   08/30/19 137 lb (62.1 kg)  08/26/19 145 lb (65.8 kg)   Lab Results  Component Value Date   CREATININE 1.27 (H) 08/26/2019    Physical Exam Vitals and nursing note reviewed.  Constitutional:      Appearance: He is well-developed.  HENT:     Head: Normocephalic and atraumatic.  Neck:     Vascular: No JVD.  Cardiovascular:     Rate and Rhythm: Normal rate. Rhythm irregular.  Pulmonary:     Effort: Pulmonary effort is normal. No respiratory distress.     Breath sounds: No wheezing or rales.  Abdominal:     Palpations: Abdomen is soft.     Tenderness: There is no abdominal tenderness.  Musculoskeletal:     Cervical back: Neck supple.     Right lower leg: No tenderness. Edema (1+ pitting) present.     Left lower leg: No tenderness. Edema (1+ pitting) present.  Skin:    General: Skin is warm and dry.  Neurological:     General: No focal deficit present.     Mental Status: He is alert and oriented to person, place, and time.  Psychiatric:        Mood and Affect: Mood normal.        Behavior: Behavior normal.     Assessment & Plan:  1: Heart failure with unknown ejection fraction- - NYHA III - euvolemic today - weighing daily; reminded to call for an overnight weight gain of >2 pounds or a weekly weight gain of >5 pounds - weight down 2.4 pounds from last visit here 1 week ago - not adding salt and says that he tries to eat low sodium in general; daughter says patient is "not eating/ drinking much of anything" and patient says that he has a decreased appetite; difficulty in taking his medications because he's not eating - has been quite active with tap dancing - echo will need to be ordered by cardiology after this acute event gets worked up - furosemide 20mg  daily and potassium 67meq daily started at last visit; need to check labs but since cardiology is working him in, will defer to them - BNP 08/26/19 was 1932.2 - has received both COVID vaccines  2: New onset atrial  fibrillation- - EKG done in the office shows atrial fibrillation; previous EKG done 08/27/19 in the ED showed NSR with T wave changes - called [facility] cardiology to see if next week's appointment could be moved up to today; on called provider Lyman Bishop) said he could see him today and to send him over - copy of office EKG given to patient's daughter - may need ED work-up  3: GERD- - saw PCP Deloria Lair) 08/26/19 and sees PCP ([place]) 10/28/19 - BMP reviewed and showed sodium 138, potassium 4.4, creatinine 1.27 & GFR 47   Medication bottles were reviewed.   Follow-up pending cardiology evaluation.

## 2019-09-06 NOTE — Patient Instructions (Signed)
Continue weighing daily and call for an overnight weight gain of > 2 pounds or a weekly weight gain of >5 pounds. 

## 2019-09-10 ENCOUNTER — Other Ambulatory Visit: Payer: Self-pay

## 2019-09-10 ENCOUNTER — Ambulatory Visit: Payer: Medicare Other | Attending: Internal Medicine

## 2019-09-10 DIAGNOSIS — Z20822 Contact with and (suspected) exposure to covid-19: Secondary | ICD-10-CM

## 2019-09-11 ENCOUNTER — Telehealth: Payer: Self-pay

## 2019-09-11 LAB — NOVEL CORONAVIRUS, NAA: SARS-CoV-2, NAA: DETECTED — AB

## 2019-09-11 LAB — SARS-COV-2, NAA 2 DAY TAT

## 2019-09-11 NOTE — Telephone Encounter (Signed)
Received call from patient checking Covid results.  Advised results are positive.  Advised to contact PCP or visit ED if symptoms worsen.

## 2019-09-12 ENCOUNTER — Telehealth: Payer: Self-pay | Admitting: Unknown Physician Specialty

## 2019-09-12 NOTE — Telephone Encounter (Signed)
Called to discuss with [name] about Covid symptoms and the use of bamlanivimab, a monoclonal antibody infusion for those with mild to moderate Covid symptoms and at a high risk of hospitalization.     Pt does not qualify for infusion therapy as he has asymptomatic infection. Isolation precautions discussed. Advised to contact back for consideration should he develop symptoms. Patient verbalized understanding.      There are no problems to display for this patient.

## 2019-09-17 ENCOUNTER — Other Ambulatory Visit: Payer: Self-pay | Admitting: Internal Medicine

## 2019-09-17 DIAGNOSIS — R1084 Generalized abdominal pain: Secondary | ICD-10-CM

## 2019-09-24 ENCOUNTER — Other Ambulatory Visit: Payer: Self-pay

## 2019-09-24 ENCOUNTER — Ambulatory Visit
Admission: RE | Admit: 2019-09-24 | Discharge: 2019-09-24 | Disposition: A | Discharge status: Home / Self Care | Payer: Medicare Other | Source: Ambulatory Visit | Attending: Internal Medicine | Admitting: Internal Medicine

## 2019-09-24 DIAGNOSIS — R1084 Generalized abdominal pain: Secondary | ICD-10-CM | POA: Diagnosis not present

## 2019-09-27 DIAGNOSIS — J90 Pleural effusion, not elsewhere classified: Secondary | ICD-10-CM | POA: Insufficient documentation

## 2019-09-27 DIAGNOSIS — I700 Atherosclerosis of aorta: Secondary | ICD-10-CM | POA: Insufficient documentation

## 2019-09-27 DIAGNOSIS — I2510 Atherosclerotic heart disease of native coronary artery without angina pectoris: Secondary | ICD-10-CM | POA: Insufficient documentation

## 2019-11-04 ENCOUNTER — Other Ambulatory Visit: Payer: Self-pay

## 2019-11-04 ENCOUNTER — Ambulatory Visit (INDEPENDENT_AMBULATORY_CARE_PROVIDER_SITE_OTHER): Payer: Medicare Other | Admitting: Dermatology

## 2019-11-04 DIAGNOSIS — Z1283 Encounter for screening for malignant neoplasm of skin: Secondary | ICD-10-CM | POA: Diagnosis not present

## 2019-11-04 DIAGNOSIS — L814 Other melanin hyperpigmentation: Secondary | ICD-10-CM | POA: Diagnosis not present

## 2019-11-04 DIAGNOSIS — L578 Other skin changes due to chronic exposure to nonionizing radiation: Secondary | ICD-10-CM | POA: Diagnosis not present

## 2019-11-04 DIAGNOSIS — L570 Actinic keratosis: Secondary | ICD-10-CM

## 2019-11-04 DIAGNOSIS — D229 Melanocytic nevi, unspecified: Secondary | ICD-10-CM

## 2019-11-04 DIAGNOSIS — D692 Other nonthrombocytopenic purpura: Secondary | ICD-10-CM

## 2019-11-04 MED ORDER — FLUOROURACIL 5 % EX CREA: TOPICAL_CREAM | Freq: Two times a day (BID) | CUTANEOUS | 1 refills | Status: DC

## 2019-11-04 NOTE — Patient Instructions (Addendum)
Cryotherapy Aftercare  . Wash gently with soap and water everyday.   Marland Kitchen Apply Vaseline and Band-Aid daily until healed.    Fluorouracil 5% cream - Apply a thin coat 2 times a day to scalp for 4 weeks.

## 2019-11-04 NOTE — Progress Notes (Signed)
   New Patient Visit  Subjective  Alejandro Moore is a 86 y.o. male who presents for the following: Annual Exam (Total body skin exam, hx of AKs, no hx of skin ca) and scaly areas (scalp, pt applying vinager on the scaly areas and polysporin, LN2 >73yr in [place], pt didnt feel the LN2 helped). The patient presents for Total-Body Skin Exam (TBSE) for skin cancer screening and mole check.  The following portions of the chart were reviewed this encounter and updated as appropriate:  Tobacco  Allergies  Meds  Problems  Med Hx  Surg Hx  Fam Hx     Review of Systems:  No other skin or systemic complaints except as noted in HPI or Assessment and Plan.  Objective  Well appearing patient in no apparent distress; mood and affect are within normal limits.  A full examination was performed including scalp, head, eyes, ears, nose, lips, neck, chest, axillae, abdomen, back, buttocks, bilateral upper extremities, bilateral lower extremities, hands, feet, fingers, toes, fingernails, and toenails. All findings within normal limits unless otherwise noted below.  Objective  Scalp x 6 (6): Pink scaly macules    Assessment & Plan    AK (actinic keratosis) (6) Scalp x 6  Start 5FU bid for 4 weeks to scalp  fluorouracil (EFUDEX) 5 % cream - Scalp x 6  Destruction of lesion - Scalp x 6 Complexity: simple   Destruction method: cryotherapy   Informed consent: discussed and consent obtained   Timeout:  patient name, date of birth, surgical site, and procedure verified Lesion destroyed using liquid nitrogen: Yes   Region frozen until ice ball extended beyond lesion: Yes   Outcome: patient tolerated procedure well with no complications   Post-procedure details: wound care instructions given    Skin cancer screening   Lentigines - Scattered tan macules - Discussed due to sun exposure - Benign, observe - Call for any changes  Melanocytic Nevi - Tan-brown and/or pink-flesh-colored symmetric  macules and papules - Benign appearing on exam today - Observation - Call clinic for new or changing moles - Recommend daily use of broad spectrum spf 30+ sunscreen to sun-exposed areas.   Actinic Damage - diffuse scaly erythematous macules with underlying dyspigmentation - Recommend daily broad spectrum sunscreen SPF 30+ to sun-exposed areas, reapply every 2 hours as needed.  - Call for new or changing lesions.  Skin cancer screening performed today.  Purpura - Violaceous macules and patches - Benign - Related to age, sun damage and/or use of blood thinners - Observe - Can use OTC arnica containing moisturizer such as Dermend Bruise Formula if desired - Call for worsening or other concerns   Return in about 6 weeks (around 12/16/2019) for 6-8wks, AK f/u.   I, Othelia Pulling, RMA, am acting as scribe for Sarina Ser, MD .  Documentation: I have reviewed the above documentation for accuracy and completeness, and I agree with the above.  Sarina Ser, MD

## 2019-11-08 ENCOUNTER — Encounter: Payer: Self-pay | Admitting: Dermatology

## 2019-11-23 ENCOUNTER — Other Ambulatory Visit: Payer: Self-pay | Admitting: Family

## 2019-12-09 ENCOUNTER — Telehealth: Payer: Self-pay

## 2019-12-09 NOTE — Telephone Encounter (Signed)
Pt daughter called, she would like to know if pt can use Polysporin on his scalp post Efudex treatment, dry, itchy scalp,  discussed with pt daughter try otc plain vaseline  And if no better call back

## 2019-12-30 ENCOUNTER — Other Ambulatory Visit: Payer: Self-pay

## 2019-12-30 ENCOUNTER — Ambulatory Visit (INDEPENDENT_AMBULATORY_CARE_PROVIDER_SITE_OTHER): Payer: Medicare Other | Admitting: Dermatology

## 2019-12-30 ENCOUNTER — Ambulatory Visit: Payer: Medicare Other | Admitting: Dermatology

## 2019-12-30 DIAGNOSIS — D485 Neoplasm of uncertain behavior of skin: Secondary | ICD-10-CM | POA: Diagnosis not present

## 2019-12-30 DIAGNOSIS — L578 Other skin changes due to chronic exposure to nonionizing radiation: Secondary | ICD-10-CM | POA: Diagnosis not present

## 2019-12-30 NOTE — Progress Notes (Signed)
   Follow-Up Visit   Subjective  Alejandro Moore is a 86 y.o. male who presents for the following: Actinic Keratosis (scalp, 8 week follow-up). After healed from LN2 treatment last visit, patient used 5FU x 4 weeks to scalp. Patient had a "good" reaction. He has one area left on his scalp that has not healed.  The following portions of the chart were reviewed this encounter and updated as appropriate:  Tobacco  Allergies  Meds  Problems  Med Hx  Surg Hx  Fam Hx     Review of Systems:  No other skin or systemic complaints except as noted in HPI or Assessment and Plan.  Objective  Well appearing patient in no apparent distress; mood and affect are within normal limits.  A focused examination was performed including scalp. Relevant physical exam findings are noted in the Assessment and Plan.  Objective  Left Scalp: 1.4 cm crusted ulcer        Assessment & Plan   Actinic Damage - diffuse scaly erythematous macules with underlying dyspigmentation - Recommend daily broad spectrum sunscreen SPF 30+ to sun-exposed areas, reapply every 2 hours as needed.  - Call for new or changing lesions.   Neoplasm of uncertain behavior of skin Left Scalp  Skin / nail biopsy Type of biopsy: tangential   Informed consent: discussed and consent obtained   Timeout: patient name, date of birth, surgical site, and procedure verified   Procedure prep:  Patient was prepped and draped in usual sterile fashion Prep type:  Isopropyl alcohol Anesthesia: the lesion was anesthetized in a standard fashion   Anesthetic:  1% lidocaine w/ epinephrine 1-100,000 buffered w/ 8.4% NaHCO3 Instrument used: flexible razor blade   Hemostasis achieved with: pressure, aluminum chloride and electrodesiccation   Outcome: patient tolerated procedure well   Post-procedure details: sterile dressing applied and wound care instructions given   Dressing type: bandage and petrolatum    Specimen 1 - Surgical  pathology Differential Diagnosis: R/O SCC vs BCC vs Traumatic Ulcer Check Margins: No 1.4 cm crusted ulcer  Return in about 6 months (around 06/28/2020) for AKs.   Lindi Adie, CMA, am acting as scribe for Sarina Ser, MD .  Documentation: I have reviewed the above documentation for accuracy and completeness, and I agree with the above.  Sarina Ser, MD

## 2019-12-30 NOTE — Patient Instructions (Signed)

## 2019-12-31 ENCOUNTER — Encounter: Payer: Self-pay | Admitting: Dermatology

## 2020-01-07 ENCOUNTER — Telehealth: Payer: Self-pay

## 2020-01-07 MED ORDER — MUPIROCIN 2 % EX OINT
1.0000 | TOPICAL_OINTMENT | Freq: Three times a day (TID) | CUTANEOUS | 1 refills | Status: DC
Start: 2020-01-07 — End: 2020-07-25

## 2020-01-07 NOTE — Telephone Encounter (Signed)
-----   Message from Ralene Bathe, MD sent at 01/07/2020  9:04 AM EDT ----- Skin , left scalp ULCER WITH UNDERLYING SUPPURATIVE INFLAMMATION, IMPETIGINIZED, SEE DESCRIPTION  Benign ulcer (trauma related?) No cancer, but has bacteria = impetiginized Send in Mupirocin oint to use tid to aa until healed. Recheck next visit Call for appt if not healed in 6 weeks

## 2020-01-07 NOTE — Addendum Note (Signed)
Addended by: Johnsie Kindred R on: 01/07/2020 11:25 AM   Modules accepted: Orders

## 2020-01-07 NOTE — Telephone Encounter (Signed)
Patient's daughter advised of biopsy results and RX sent in.

## 2020-01-07 NOTE — Telephone Encounter (Signed)
LM on VM please return my call  

## 2020-02-17 DIAGNOSIS — I480 Paroxysmal atrial fibrillation: Secondary | ICD-10-CM | POA: Insufficient documentation

## 2020-02-23 ENCOUNTER — Emergency Department (HOSPITAL_COMMUNITY): Payer: Medicare Other

## 2020-02-23 ENCOUNTER — Emergency Department (HOSPITAL_COMMUNITY)
Admission: EM | Admit: 2020-02-23 | Discharge: 2020-02-23 | Disposition: A | Discharge status: Home / Self Care | Payer: Medicare Other | Attending: Emergency Medicine | Admitting: Emergency Medicine

## 2020-02-23 DIAGNOSIS — S51011A Laceration without foreign body of right elbow, initial encounter: Secondary | ICD-10-CM | POA: Diagnosis not present

## 2020-02-23 DIAGNOSIS — Z23 Encounter for immunization: Secondary | ICD-10-CM | POA: Diagnosis not present

## 2020-02-23 DIAGNOSIS — W19XXXA Unspecified fall, initial encounter: Secondary | ICD-10-CM | POA: Insufficient documentation

## 2020-02-23 DIAGNOSIS — S0101XA Laceration without foreign body of scalp, initial encounter: Secondary | ICD-10-CM | POA: Diagnosis present

## 2020-02-23 DIAGNOSIS — Y92009 Unspecified place in unspecified non-institutional (private) residence as the place of occurrence of the external cause: Secondary | ICD-10-CM | POA: Insufficient documentation

## 2020-02-23 MED ORDER — LIDOCAINE-EPINEPHRINE 2 %-1:100000 IJ SOLN: 20.0000 mL | Freq: Once | INTRAMUSCULAR | Status: DC

## 2020-02-23 MED ORDER — TETANUS-DIPHTH-ACELL PERTUSSIS 5-2.5-18.5 LF-MCG/0.5 IM SUSY
0.5000 mL | PREFILLED_SYRINGE | Freq: Once | INTRAMUSCULAR | Status: AC
  Administered 2020-02-23: 0.5 mL via INTRAMUSCULAR
  Filled 2020-02-23: qty 0.5
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
## 2020-02-23 NOTE — Discharge Instructions (Signed)
Your CT scan was unremarkable.  Sutures need to come out in the week. You can have them taken out in urgent care or at your doctor's office or the ED.  See your doctor for follow-up  Return to ER if you have worse headaches, vomiting, weakness, numbness.
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
## 2020-06-29 ENCOUNTER — Other Ambulatory Visit: Payer: Self-pay

## 2020-06-29 ENCOUNTER — Ambulatory Visit (INDEPENDENT_AMBULATORY_CARE_PROVIDER_SITE_OTHER): Payer: Medicare Other | Admitting: Dermatology

## 2020-06-29 DIAGNOSIS — L570 Actinic keratosis: Secondary | ICD-10-CM | POA: Diagnosis not present

## 2020-06-29 DIAGNOSIS — L821 Other seborrheic keratosis: Secondary | ICD-10-CM | POA: Diagnosis not present

## 2020-06-29 DIAGNOSIS — L578 Other skin changes due to chronic exposure to nonionizing radiation: Secondary | ICD-10-CM

## 2020-06-29 DIAGNOSIS — D692 Other nonthrombocytopenic purpura: Secondary | ICD-10-CM

## 2020-06-29 NOTE — Progress Notes (Signed)
   Follow-Up Visit   Subjective  Alejandro Moore is a 87 y.o. male who presents for the following: Follow-up (Patient here today for 6 month AK follow up. He has not noticed any new spots. ). Patient accompanied by daughter who contributes to history.   The following portions of the chart were reviewed this encounter and updated as appropriate:   Tobacco  Allergies  Meds  Problems  Med Hx  Surg Hx  Fam Hx     Review of Systems:  No other skin or systemic complaints except as noted in HPI or Assessment and Plan.  Objective  Well appearing patient in no apparent distress; mood and affect are within normal limits.  A focused examination was performed including scalp, face. Relevant physical exam findings are noted in the Assessment and Plan.  Objective  Scalp (3): Erythematous thin papules/macules with gritty scale.    Assessment & Plan  AK (actinic keratosis) (3) Scalp  Destruction of lesion - Scalp Complexity: simple   Destruction method: cryotherapy   Informed consent: discussed and consent obtained   Timeout:  patient name, date of birth, surgical site, and procedure verified Lesion destroyed using liquid nitrogen: Yes   Region frozen until ice ball extended beyond lesion: Yes   Outcome: patient tolerated procedure well with no complications   Post-procedure details: wound care instructions given    Seborrheic Keratoses - Stuck-on, waxy, tan-brown papules and plaques  - Discussed benign etiology and prognosis. - Observe - Call for any changes  Actinic Damage - chronic, secondary to cumulative UV radiation exposure/sun exposure over time - diffuse scaly erythematous macules with underlying dyspigmentation - Recommend daily broad spectrum sunscreen SPF 30+ to sun-exposed areas, reapply every 2 hours as needed.  - Recommend staying in the shade or wearing long sleeves, sun glasses (UVA+UVB protection) and wide brim hats (4-inch brim around the entire circumference  of the hat). - Call for new or changing lesions.  Purpura - Chronic; persistent and recurrent.  Treatable, but not curable. - Violaceous macules and patches - Benign - Related to trauma, age, sun damage and/or use of blood thinners, chronic use of topical and/or oral steroids - Observe - Can use OTC arnica containing moisturizer such as Dermend Bruise Formula if desired - Call for worsening or other concerns  Return in about 1 year (around 06/29/2021) for AK follow up.  Graciella Belton, RMA, am acting as scribe for Sarina Ser, MD . Documentation: I have reviewed the above documentation for accuracy and completeness, and I agree with the above.  Sarina Ser, MD

## 2020-06-29 NOTE — Patient Instructions (Signed)

## 2020-07-01 ENCOUNTER — Encounter: Payer: Self-pay | Admitting: Dermatology

## 2020-07-25 ENCOUNTER — Emergency Department (HOSPITAL_COMMUNITY): Payer: Medicare Other

## 2020-07-25 ENCOUNTER — Inpatient Hospital Stay (HOSPITAL_COMMUNITY)
Admission: EM | Admit: 2020-07-25 | Discharge: 2020-07-27 | DRG: 062 | Disposition: A | Discharge status: Home / Self Care | Payer: Medicare Other | Attending: Neurology | Admitting: Neurology

## 2020-07-25 ENCOUNTER — Encounter (HOSPITAL_COMMUNITY): Payer: Self-pay | Admitting: Pharmacy Technician

## 2020-07-25 ENCOUNTER — Inpatient Hospital Stay (HOSPITAL_COMMUNITY): Payer: Medicare Other

## 2020-07-25 DIAGNOSIS — E538 Deficiency of other specified B group vitamins: Secondary | ICD-10-CM | POA: Diagnosis present

## 2020-07-25 DIAGNOSIS — R4701 Aphasia: Secondary | ICD-10-CM | POA: Diagnosis present

## 2020-07-25 DIAGNOSIS — I5021 Acute systolic (congestive) heart failure: Secondary | ICD-10-CM | POA: Diagnosis not present

## 2020-07-25 DIAGNOSIS — N183 Chronic kidney disease, stage 3 unspecified: Secondary | ICD-10-CM | POA: Diagnosis present

## 2020-07-25 DIAGNOSIS — E785 Hyperlipidemia, unspecified: Secondary | ICD-10-CM | POA: Diagnosis present

## 2020-07-25 DIAGNOSIS — Z20822 Contact with and (suspected) exposure to covid-19: Secondary | ICD-10-CM | POA: Diagnosis present

## 2020-07-25 DIAGNOSIS — R4781 Slurred speech: Secondary | ICD-10-CM | POA: Diagnosis present

## 2020-07-25 DIAGNOSIS — I130 Hypertensive heart and chronic kidney disease with heart failure and stage 1 through stage 4 chronic kidney disease, or unspecified chronic kidney disease: Secondary | ICD-10-CM | POA: Diagnosis present

## 2020-07-25 DIAGNOSIS — I63412 Cerebral infarction due to embolism of left middle cerebral artery: Secondary | ICD-10-CM | POA: Diagnosis not present

## 2020-07-25 DIAGNOSIS — I509 Heart failure, unspecified: Secondary | ICD-10-CM | POA: Diagnosis present

## 2020-07-25 DIAGNOSIS — I639 Cerebral infarction, unspecified: Secondary | ICD-10-CM | POA: Diagnosis present

## 2020-07-25 DIAGNOSIS — I2510 Atherosclerotic heart disease of native coronary artery without angina pectoris: Secondary | ICD-10-CM | POA: Diagnosis present

## 2020-07-25 DIAGNOSIS — I482 Chronic atrial fibrillation, unspecified: Secondary | ICD-10-CM

## 2020-07-25 DIAGNOSIS — I4819 Other persistent atrial fibrillation: Secondary | ICD-10-CM | POA: Diagnosis present

## 2020-07-25 DIAGNOSIS — I429 Cardiomyopathy, unspecified: Secondary | ICD-10-CM | POA: Diagnosis present

## 2020-07-25 DIAGNOSIS — K219 Gastro-esophageal reflux disease without esophagitis: Secondary | ICD-10-CM | POA: Diagnosis present

## 2020-07-25 DIAGNOSIS — Z9282 Status post administration of tPA (rtPA) in a different facility within the last 24 hours prior to admission to current facility: Secondary | ICD-10-CM | POA: Diagnosis not present

## 2020-07-25 DIAGNOSIS — R200 Anesthesia of skin: Secondary | ICD-10-CM

## 2020-07-25 DIAGNOSIS — Z79899 Other long term (current) drug therapy: Secondary | ICD-10-CM | POA: Diagnosis not present

## 2020-07-25 DIAGNOSIS — E7800 Pure hypercholesterolemia, unspecified: Secondary | ICD-10-CM | POA: Diagnosis not present

## 2020-07-25 DIAGNOSIS — R29702 NIHSS score 2: Secondary | ICD-10-CM | POA: Diagnosis present

## 2020-07-25 DIAGNOSIS — I255 Ischemic cardiomyopathy: Secondary | ICD-10-CM | POA: Diagnosis not present

## 2020-07-25 DIAGNOSIS — R29898 Other symptoms and signs involving the musculoskeletal system: Secondary | ICD-10-CM

## 2020-07-25 DIAGNOSIS — H3530 Unspecified macular degeneration: Secondary | ICD-10-CM | POA: Diagnosis present

## 2020-07-25 DIAGNOSIS — I6389 Other cerebral infarction: Secondary | ICD-10-CM | POA: Diagnosis present

## 2020-07-25 DIAGNOSIS — I10 Essential (primary) hypertension: Secondary | ICD-10-CM | POA: Diagnosis not present

## 2020-07-25 LAB — I-STAT CHEM 8, ED
BUN: 41 mg/dL — ABNORMAL HIGH (ref 8–23)
Calcium, Ion: 1.11 mmol/L — ABNORMAL LOW (ref 1.15–1.40)
Chloride: 103 mmol/L (ref 98–111)
Creatinine, Ser: 1.3 mg/dL — ABNORMAL HIGH (ref 0.61–1.24)
Glucose, Bld: 102 mg/dL — ABNORMAL HIGH (ref 70–99)
HCT: 39 % (ref 39.0–52.0)
Hemoglobin: 13.3 g/dL (ref 13.0–17.0)
Potassium: 4.4 mmol/L (ref 3.5–5.1)
Sodium: 138 mmol/L (ref 135–145)
TCO2: 30 mmol/L (ref 22–32)

## 2020-07-25 LAB — DIFFERENTIAL
Abs Immature Granulocytes: 0.03 10*3/uL (ref 0.00–0.07)
Basophils Absolute: 0 10*3/uL (ref 0.0–0.1)
Basophils Relative: 0 %
Eosinophils Absolute: 0 10*3/uL (ref 0.0–0.5)
Eosinophils Relative: 0 %
Immature Granulocytes: 1 %
Lymphocytes Relative: 11 %
Lymphs Abs: 0.5 10*3/uL — ABNORMAL LOW (ref 0.7–4.0)
Monocytes Absolute: 0.5 10*3/uL (ref 0.1–1.0)
Monocytes Relative: 10 %
Neutro Abs: 3.8 10*3/uL (ref 1.7–7.7)
Neutrophils Relative %: 78 %

## 2020-07-25 LAB — COMPREHENSIVE METABOLIC PANEL
ALT: 28 U/L (ref 0–44)
AST: 26 U/L (ref 15–41)
Albumin: 3.4 g/dL — ABNORMAL LOW (ref 3.5–5.0)
Alkaline Phosphatase: 98 U/L (ref 38–126)
Anion gap: 6 (ref 5–15)
BUN: 32 mg/dL — ABNORMAL HIGH (ref 8–23)
CO2: 29 mmol/L (ref 22–32)
Calcium: 9.2 mg/dL (ref 8.9–10.3)
Chloride: 103 mmol/L (ref 98–111)
Creatinine, Ser: 1.27 mg/dL — ABNORMAL HIGH (ref 0.61–1.24)
GFR, Estimated: 51 mL/min — ABNORMAL LOW (ref >60)
Glucose, Bld: 108 mg/dL — ABNORMAL HIGH (ref 70–99)
Potassium: 4.3 mmol/L (ref 3.5–5.1)
Sodium: 138 mmol/L (ref 135–145)
Total Bilirubin: 1.1 mg/dL (ref 0.3–1.2)
Total Protein: 6.8 g/dL (ref 6.5–8.1)

## 2020-07-25 LAB — RESP PANEL BY RT-PCR (FLU A&B, COVID) ARPGX2
Influenza A by PCR: NEGATIVE
Influenza B by PCR: NEGATIVE
SARS Coronavirus 2 by RT PCR: NEGATIVE

## 2020-07-25 LAB — PROTIME-INR
INR: 1.2 (ref 0.8–1.2)
Prothrombin Time: 15.2 seconds (ref 11.4–15.2)

## 2020-07-25 LAB — CBC
HCT: 37.7 % — ABNORMAL LOW (ref 39.0–52.0)
Hemoglobin: 12.4 g/dL — ABNORMAL LOW (ref 13.0–17.0)
MCH: 35.7 pg — ABNORMAL HIGH (ref 26.0–34.0)
MCHC: 32.9 g/dL (ref 30.0–36.0)
MCV: 108.6 fL — ABNORMAL HIGH (ref 80.0–100.0)
Platelets: 112 10*3/uL — ABNORMAL LOW (ref 150–400)
RBC: 3.47 MIL/uL — ABNORMAL LOW (ref 4.22–5.81)
RDW: 14.7 % (ref 11.5–15.5)
WBC: 4.8 10*3/uL (ref 4.0–10.5)
nRBC: 0 % (ref 0.0–0.2)

## 2020-07-25 LAB — TSH: TSH: 0.567 u[IU]/mL (ref 0.350–4.500)

## 2020-07-25 LAB — APTT: aPTT: 35 seconds (ref 24–36)

## 2020-07-25 MED ORDER — PANTOPRAZOLE SODIUM 40 MG IV SOLR
40.0000 mg | Freq: Every day | INTRAVENOUS | Status: DC
  Administered 2020-07-25: 40 mg via INTRAVENOUS

## 2020-07-25 MED ORDER — ACETAMINOPHEN 650 MG RE SUPP: 650.0000 mg | RECTAL | Status: DC | PRN

## 2020-07-25 MED ORDER — SODIUM CHLORIDE 0.9% FLUSH
3.0000 mL | Freq: Once | INTRAVENOUS | Status: DC
Start: 2020-07-25 — End: 2020-07-27

## 2020-07-25 MED ORDER — ASPIRIN EC 81 MG PO TBEC: 81.0000 mg | DELAYED_RELEASE_TABLET | Freq: Every day | ORAL | Status: DC

## 2020-07-25 MED ORDER — STROKE: EARLY STAGES OF RECOVERY BOOK
Freq: Once | Status: DC
  Filled 2020-07-25: qty 1

## 2020-07-25 MED ORDER — SODIUM CHLORIDE 0.9 % IV SOLN
50.0000 mL | Freq: Once | INTRAVENOUS | Status: AC
  Administered 2020-07-25: 50 mL via INTRAVENOUS

## 2020-07-25 MED ORDER — ACETAMINOPHEN 325 MG PO TABS: 650.0000 mg | ORAL_TABLET | ORAL | Status: DC | PRN

## 2020-07-25 MED ORDER — SODIUM CHLORIDE 0.9 % IV SOLN: INTRAVENOUS | Status: DC

## 2020-07-25 MED ORDER — ACETAMINOPHEN 160 MG/5ML PO SOLN: 650.0000 mg | ORAL | Status: DC | PRN

## 2020-07-25 MED ORDER — CLOPIDOGREL BISULFATE 75 MG PO TABS: 75.0000 mg | ORAL_TABLET | Freq: Every day | ORAL | Status: DC

## 2020-07-25 MED ORDER — IOHEXOL 350 MG/ML SOLN
75.0000 mL | Freq: Once | INTRAVENOUS | Status: AC | PRN
  Administered 2020-07-25: 75 mL via INTRAVENOUS

## 2020-07-25 MED ORDER — SENNOSIDES-DOCUSATE SODIUM 8.6-50 MG PO TABS: 1.0000 | ORAL_TABLET | Freq: Every evening | ORAL | Status: DC | PRN

## 2020-07-25 MED ORDER — ALTEPLASE (STROKE) FULL DOSE INFUSION
0.9000 mg/kg | Freq: Once | INTRAVENOUS | Status: AC
  Administered 2020-07-25: 55.3 mg via INTRAVENOUS
  Filled 2020-07-25: qty 100

## 2020-07-25 NOTE — ED Notes (Signed)
Pt alteplase line switched to NS for 50cc.

## 2020-07-25 NOTE — ED Notes (Signed)
Weakness and numbness returning to R extremities. Neuro at the bedside.

## 2020-07-25 NOTE — Progress Notes (Signed)
Patient arrived to ICU, belongings present: clothes, shoes, brown belt, yellow metal ring on left ring finger and upper/lower dentures in mouth.
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
## 2020-07-25 NOTE — Progress Notes (Signed)
PHARMACIST CODE STROKE RESPONSE  Notified to mix tPA at 1338 by Dr. Erlinda Hong Delivered tPA to RN at 1342  tPA dose = 5.5mg  bolus over 1 minute followed by 49.8mg  for a total dose of 55.3mg  over 1 hour  Issues/delays encountered (if applicable): Symptom change after initial stroke response  Alejandro Moore 07/25/20 1:46 PM

## 2020-07-25 NOTE — ED Notes (Signed)
Decision made to give tPa

## 2020-07-25 NOTE — ED Triage Notes (Signed)
Pt bib ems as a code stroke. Per ems, approx 1215 pt with slurred speech and R arm numbness. Slurred speech did not last long, and speech resolved pta.

## 2020-07-25 NOTE — ED Notes (Signed)
Pt transported to CT with RN

## 2020-07-25 NOTE — Progress Notes (Signed)
NIH 0, VSS, will use [employer] low intensity monitoring as ordered, next set VS and mNIHSS due at 1800.

## 2020-07-25 NOTE — Progress Notes (Signed)
PT Cancellation Note  Patient Details Name: Alejandro Moore MRN: [id number] DOB: 01/06/1920   Cancelled Treatment:    Reason Eval/Treat Not Completed: Active bedrest order. Will follow-up as appropriate another day.   Moishe Spice, PT, DPT Acute Rehabilitation Services  Pager: [phone] Office: [place] 07/25/2020, 5:47 PM

## 2020-07-25 NOTE — Research (Signed)
Examined pt again at 2hr mark post tPA. Pt now NIHSS = 0. He stated that weakness and numbness of right arm has been resolved. Daughter noticed previous b/l arm bruises seemed more prominent than before, will continue to monitor. At this time, pt is good candidate for [facility] main trail. Will enroll. I have put in order for low intensity monitoring. RN notified.  Rosalin Hawking, MD PhD Stroke Neurology 07/25/2020 3:49 PM

## 2020-07-25 NOTE — ED Provider Notes (Signed)
[facility] NEURO/TRAUMA/SURGICAL ICU Provider Note   CSN: [id number] Arrival date & time: 07/25/20  1304  An emergency department physician performed an initial assessment on this suspected stroke patient at 43.  History Chief Complaint  Patient presents with  . Code Stroke    Alejandro Moore is a 87 y.o. male.  The history is provided by the patient and medical records. No language interpreter was used.  Neurologic Problem This is a new problem. The current episode started 1 to 2 hours ago. The problem occurs constantly. The problem has not changed since onset.Pertinent negatives include no chest pain, no abdominal pain, no headaches and no shortness of breath. Nothing aggravates the symptoms. Nothing relieves the symptoms. He has tried nothing for the symptoms. The treatment provided no relief.       History reviewed. No pertinent past medical history.  Patient Active Problem List   Diagnosis Date Noted  . Stroke (cerebrum) ([place]) 07/25/2020    Past Surgical History:  Procedure Laterality Date  . SHOULDER SURGERY         No family history on file.  Social History   Tobacco Use  . Smoking status: Never Smoker  . Smokeless tobacco: Never Used  Vaping Use  . Vaping Use: Never used  Substance Use Topics  . Alcohol use: Never  . Drug use: Never    Home Medications Prior to Admission medications   Medication Sig Start Date End Date Taking? Authorizing Provider  acetaminophen (TYLENOL) 500 MG tablet Take 500 mg by mouth every 6 (six) hours as needed.    [provider]  amoxicillin-clavulanate (AUGMENTIN) 875-125 MG tablet Take 1 tablet by mouth 2 (two) times daily. 08/26/19   Carrie Mew, MD  esomeprazole (NEXIUM) 20 MG capsule Take 20 mg by mouth daily.    [provider]  fexofenadine (ALLEGRA) 60 MG tablet Take 180 mg by mouth daily.    [provider]  fluticasone (FLONASE) 50 MCG/ACT nasal spray Place 2 sprays into both  nostrils daily.    [provider]  furosemide (LASIX) 20 MG tablet TAKE 1 TABLET BY MOUTH EVERY DAY 11/24/19   Darylene Price A, FNP  metoprolol tartrate (LOPRESSOR) 25 MG tablet Take 25 mg by mouth 2 (two) times daily. 09/27/19   [provider]  Multiple Vitamins-Minerals (ICAPS AREDS 2 PO) Take 1 tablet by mouth 2 (two) times daily.    [provider]  Multiple Vitamins-Minerals (THERAPEUTIC MULTIVIT/MINERAL PO) Take 1 tablet by mouth daily.    [provider]  mupirocin ointment (BACTROBAN) 2 % Apply 1 application topically 3 (three) times daily. 01/07/20   Ralene Bathe, MD  potassium chloride (KLOR-CON) 10 MEQ tablet TAKE 1 TABLET BY MOUTH EVERY DAY 11/24/19   Darylene Price A, FNP  tamsulosin (FLOMAX) 0.4 MG CAPS capsule Take 0.4 mg by mouth daily. 04/26/19   [provider]    Allergies    Patient has no known allergies.  Review of Systems   Review of Systems  Constitutional: Negative for chills, fatigue and unexpected weight change.  HENT: Negative for congestion.   Eyes: Negative for visual disturbance.  Respiratory: Negative for cough, chest tightness, shortness of breath and wheezing.   Cardiovascular: Negative for chest pain.  Gastrointestinal: Negative for abdominal pain, constipation, diarrhea, nausea and vomiting.  Musculoskeletal: Negative for back pain, neck pain and neck stiffness.  Skin: Negative for rash and wound.  Neurological: Positive for speech difficulty, weakness and numbness. Negative for dizziness, seizures,  facial asymmetry, light-headedness and headaches.  Psychiatric/Behavioral: Negative for agitation and confusion.  All other systems reviewed and are negative.   Physical Exam Updated Vital Signs BP 102/66   Pulse 62   Temp 98.4 F (36.9 C) (Oral)   Resp 18   Ht 5\' 5"  (1.651 m)   Wt 59.9 kg   SpO2 (!) 87%   BMI 21.98 kg/m   Physical Exam Vitals and nursing note reviewed.  Constitutional:       General: He is not in acute distress.    Appearance: He is well-developed. He is not ill-appearing, toxic-appearing or diaphoretic.  HENT:     Head: Normocephalic and atraumatic.     Nose: No congestion or rhinorrhea.     Mouth/Throat:     Mouth: Mucous membranes are moist.     Pharynx: No oropharyngeal exudate or posterior oropharyngeal erythema.  Eyes:     Extraocular Movements: Extraocular movements intact.     Conjunctiva/sclera: Conjunctivae normal.     Pupils: Pupils are equal, round, and reactive to light.  Cardiovascular:     Rate and Rhythm: Normal rate and regular rhythm.     Heart sounds: No murmur heard.   Pulmonary:     Effort: Pulmonary effort is normal. No respiratory distress.     Breath sounds: Normal breath sounds. No wheezing, rhonchi or rales.  Chest:     Chest wall: No tenderness.  Abdominal:     General: Abdomen is flat.     Palpations: Abdomen is soft.     Tenderness: There is no abdominal tenderness. There is no right CVA tenderness, left CVA tenderness, guarding or rebound.  Musculoskeletal:        General: No tenderness.     Cervical back: Neck supple. No tenderness.     Right lower leg: No edema.     Left lower leg: No edema.  Skin:    General: Skin is warm and dry.     Capillary Refill: Capillary refill takes less than 2 seconds.     Findings: No erythema.  Neurological:     General: No focal deficit present.     Mental Status: He is alert and oriented to person, place, and time.     Cranial Nerves: No cranial nerve deficit, dysarthria or facial asymmetry.     Sensory: No sensory deficit.     Motor: No weakness or abnormal muscle tone.     Coordination: Coordination normal. Finger-Nose-Finger Test normal.  Psychiatric:        Mood and Affect: Mood normal.     ED Results / Procedures / Treatments   Labs (all labs ordered are listed, but only abnormal results are displayed) Labs Reviewed  CBC - Abnormal; Notable for the following  components:      Result Value   RBC 3.47 (*)    Hemoglobin 12.4 (*)    HCT 37.7 (*)    MCV 108.6 (*)    MCH 35.7 (*)    Platelets 112 (*)    All other components within normal limits  DIFFERENTIAL - Abnormal; Notable for the following components:   Lymphs Abs 0.5 (*)    All other components within normal limits  COMPREHENSIVE METABOLIC PANEL - Abnormal; Notable for the following components:   Glucose, Bld 108 (*)    BUN 32 (*)    Creatinine, Ser 1.27 (*)    Albumin 3.4 (*)    GFR, Estimated 51 (*)    All other components within normal  limits  I-STAT CHEM 8, ED - Abnormal; Notable for the following components:   BUN 41 (*)    Creatinine, Ser 1.30 (*)    Glucose, Bld 102 (*)    Calcium, Ion 1.11 (*)    All other components within normal limits  RESP PANEL BY RT-PCR (FLU A&B, COVID) ARPGX2  PROTIME-INR  APTT  TSH  CBG MONITORING, ED    EKG None  Radiology CT HEAD CODE STROKE WO CONTRAST  Result Date: 07/25/2020 CLINICAL DATA:  Code stroke. Neuro deficit, acute, stroke suspected. Additional history provided: Slurred speech. EXAM: CT HEAD WITHOUT CONTRAST TECHNIQUE: Contiguous axial images were obtained from the base of the skull through the vertex without intravenous contrast. COMPARISON:  Head CT 02/23/2020. FINDINGS: Brain: Mild-to-moderate cerebral atrophy.  Severe cerebellar atrophy. Redemonstrated chronic lacunar infarcts within bilateral basal ganglia. Background moderate ill-defined hypoattenuation within the cerebral white matter is nonspecific, but compatible with chronic small vessel ischemic disease. There is no acute intracranial hemorrhage. No demarcated cortical infarct. No extra-axial fluid collection. No evidence of intracranial mass. No midline shift. Vascular: No hyperdense vessel.  Atherosclerotic calcifications. Skull: Normal. Negative for fracture or focal lesion. Sinuses/Orbits: Visualized orbits show no acute finding. Mild bilateral ethmoid and maxillary sinus  mucosal thickening. Small mucous retention cyst within a midline sphenoid sinus. Other: Trace right mastoid effusion. ASPECTS [facility] Stroke Program Early CT Score) - Ganglionic level infarction (caudate, lentiform nuclei, internal capsule, insula, M1-M3 cortex): 7 - Supraganglionic infarction (M4-M6 cortex): 3 Total score (0-10 with 10 being normal): 10 (when discounting chronic infarcts). These results were communicated to Dr. Erlinda Hong At 1:25 pmon 4/16/2022by text page via the [facility] messaging system. IMPRESSION: No evidence of acute intracranial abnormality. Redemonstrated chronic lacunar infarcts within the bilateral basal ganglia. Background mild-to-moderate cerebral atrophy and moderate cerebral white matter chronic small vessel ischemic disease. Redemonstrated severe cerebellar atrophy. Electronically Signed   By: Kellie Simmering DO   On: 07/25/2020 13:25   CT ANGIO HEAD NECK W WO CM (CODE STROKE)  Addendum Date: 07/25/2020   ADDENDUM REPORT: 07/25/2020 14:44 ADDENDUM: Finding omitted from the impression: 1 mm vascular protrusion arising from the cavernous right ICA, which may reflect a small aneurysm. These results were communicated to Dr. Erlinda Hong At 2:43 pmon 4/16/2022by text page via the [facility] messaging system. Electronically Signed   By: Kellie Simmering DO   On: 07/25/2020 14:44   Result Date: 07/25/2020 CLINICAL DATA:  Stroke, follow-up. Slurred speech, right arm numbness. EXAM: CT HEAD WITHOUT CONTRAST CT ANGIOGRAPHY HEAD AND NECK TECHNIQUE: Multidetector CT imaging of the head and neck was performed using the standard protocol during bolus administration of intravenous contrast. Multiplanar CT image reconstructions and MIPs were obtained to evaluate the vascular anatomy. Carotid stenosis measurements (when applicable) are obtained utilizing NASCET criteria, using the distal internal carotid diameter as the denominator. CONTRAST:  54mL OMNIPAQUE IOHEXOL 350 MG/ML SOLN COMPARISON:  Noncontrast head CT performed  earlier today 07/25/2020. FINDINGS: CTA NECK FINDINGS Aortic arch: Standard aortic branching. Atherosclerotic plaque within the visualized aortic arch and proximal major branch vessels of the neck. No hemodynamically significant innominate or proximal subclavian artery stenosis. Right carotid system: CCA and ICA patent within the neck without hemodynamically significant stenosis (50% or greater). Mild soft and calcified plaque within the carotid bifurcation. Left carotid system: CCA and ICA patent within the neck without hemodynamically significant stenosis (50% or greater). Mild calcified plaque within the carotid bifurcation. Tortuosity of the cervical ICA. Vertebral arteries: Vertebral arteries codominant patent within  the neck. Mild atherosclerotic stenosis at the origin of the left vertebral artery. Skeleton: No acute bony abnormality or aggressive osseous lesion. Cervical spondylosis with multilevel disc space narrowing, disc bulges, endplate spurring, uncovertebral hypertrophy and facet arthrosis. C5-C6 ankylosis. Mild presumed chronic T1 superior endplate compression deformity. Other neck: No neck mass or cervical lymphadenopathy. Upper chest: Partially imaged left pleural effusion. Interlobular septal thickening within the imaged lung apices, nonspecific but possibly reflecting edema. Right apical pleuroparenchymal scarring. Review of the MIP images confirms the above findings CTA HEAD FINDINGS Anterior circulation: The intracranial internal carotid arteries are patent. Calcified plaque within both vessels. Moderate stenosis of the paraclinoid segments bilaterally. The M1 middle cerebral arteries are patent. No M2 proximal branch occlusion or high-grade proximal stenosis is identified. The anterior cerebral arteries are patent. 1 mm posteriorly projecting vascular protrusion arising from the cavernous right ICA which may reflect a small aneurysm (series 9, image 85) Posterior circulation: The intracranial  vertebral arteries are patent. Side plaque within the V4 vertebral arteries bilaterally with mild stenosis. The basilar artery is patent. The posterior cerebral arteries are patent. A right posterior communicating artery is present. The left posterior communicating artery is hypoplastic or absent. Venous sinuses: Within the limitations of contrast timing, no convincing thrombus. Anatomic variants: As described Review of the MIP images confirms the above findings No emergent large vessel occlusion. These results were communicated to Dr. Erlinda Hong At 2:33 pmon 4/16/2022by text page via the [facility] messaging system. IMPRESSION: CTA neck: 1. The bilateral common and internal carotid arteries are patent within the neck without hemodynamically significant stenosis. Mild atherosclerotic plaque within the carotid bifurcations bilaterally. 2. Vertebral arteries patent within the neck. Mild atherosclerotic stenosis at the origin of the left vertebral artery. 3. Partially imaged left pleural effusion. 4. Interlobular septal thickening within the imaged lung apices, nonspecific but possibly reflecting edema. CTA head: 1. No intracranial large vessel occlusion or proximal high-grade arterial stenosis. 2. Calcified plaque results in moderate stenosis of the paraclinoid ICAs bilaterally. 3. Calcified plaque results in mild stenosis of the V4 vertebral arteries bilaterally. Electronically Signed: By: Kellie Simmering DO On: 07/25/2020 14:34    Procedures Procedures   CRITICAL CARE Performed by: Gwenyth Allegra Rishan Oyama Total critical care time: 35 minutes Critical care time was exclusive of separately billable procedures and treating other patients. Critical care was necessary to treat or prevent imminent or life-threatening deterioration. Critical care was time spent personally by me on the following activities: development of treatment plan with patient and/or surrogate as well as nursing, discussions with consultants, evaluation of  patient's response to treatment, examination of patient, obtaining history from patient or surrogate, ordering and performing treatments and interventions, ordering and review of laboratory studies, ordering and review of radiographic studies, pulse oximetry and re-evaluation of patient's condition.   Medications Ordered in ED Medications  sodium chloride flush (NS) 0.9 % injection 3 mL (has no administration in time range)   stroke: mapping our early stages of recovery book (has no administration in time range)  acetaminophen (TYLENOL) tablet 650 mg (has no administration in time range)    Or  acetaminophen (TYLENOL) 160 MG/5ML solution 650 mg (has no administration in time range)    Or  acetaminophen (TYLENOL) suppository 650 mg (has no administration in time range)  senna-docusate (Senokot-S) tablet 1 tablet (has no administration in time range)  pantoprazole (PROTONIX) injection 40 mg (has no administration in time range)  0.9 %  sodium chloride infusion ( Intravenous New Bag/Given  07/25/20 1554)  alteplase (ACTIVASE) 1 mg/mL infusion 55.3 mg (0 mg/kg  61.4 kg Intravenous Stopped 07/25/20 1443)    Followed by  0.9 %  sodium chloride infusion (50 mLs Intravenous New Bag/Given 07/25/20 1443)  iohexol (OMNIPAQUE) 350 MG/ML injection 75 mL (75 mLs Intravenous Contrast Given 07/25/20 1420)    ED Course  I have reviewed the triage vital signs and the nursing notes.  Pertinent labs & imaging results that were available during my care of the patient were reviewed by me and considered in my medical decision making (see chart for details).    MDM Rules/Calculators/A&P                          Montgomery Favor is a 87 y.o. male with unknown past medical history who presents as a code stroke for sudden onset of right arm numbness, weakness, and slurred speech.  According to the EMS, around 1215 today, patient had onset of right arm symptoms.  He noticed it feeling numb and slightly weak and then  started having difficulty with his speech.  He reports it was an aphasia and not dysarthria.  By the time patient arrived to the emergency department, his symptoms had resolved completely.  On my initial evaluation, airway was clear and he was taken quickly to the CT scanner.  CT scan does not show any evidence of acute stroke or bleed.  Patient taken back to the room and was examined.  On my exam, he had normal sensation and strength in all extremities.  Clear speech.  Symmetric smile.  Normal sensation of the face and legs.  Normal finger-nose-finger testing.  Lungs clear and chest nontender.  Abdomen nontender.  Patient resting comfortably.  Well neurology was reevaluating patient, he had onset of right arm symptoms again.  They made the decision to start tPA to treat.  They will now admit to the ICU instead of medicine admission as initially planned.  They will order MRI and further stroke work-up.  Patient started on tPA and was admitted to the ICU without further complication or difficulty.  Patient admitted for further management     Final Clinical Impression(s) / ED Diagnoses Final diagnoses:  Aphasia  Acute ischemic stroke (HCC)  Numbness  Right arm weakness    Clinical Impression: 1. Aphasia   2. Acute ischemic stroke ([place])   3. Numbness   4. Right arm weakness     Disposition: Admit  This note was prepared with assistance of Dragon voice recognition software. Occasional wrong-word or sound-a-like substitutions may have occurred due to the inherent limitations of voice recognition software.      [place] Sherburn, Gwenyth Allegra, MD 07/25/20 [phone]

## 2020-07-26 ENCOUNTER — Inpatient Hospital Stay (HOSPITAL_COMMUNITY): Payer: Medicare Other

## 2020-07-26 DIAGNOSIS — I482 Chronic atrial fibrillation, unspecified: Secondary | ICD-10-CM | POA: Diagnosis not present

## 2020-07-26 DIAGNOSIS — I5021 Acute systolic (congestive) heart failure: Secondary | ICD-10-CM | POA: Diagnosis not present

## 2020-07-26 DIAGNOSIS — I6389 Other cerebral infarction: Secondary | ICD-10-CM

## 2020-07-26 DIAGNOSIS — I63412 Cerebral infarction due to embolism of left middle cerebral artery: Secondary | ICD-10-CM | POA: Diagnosis not present

## 2020-07-26 DIAGNOSIS — I639 Cerebral infarction, unspecified: Secondary | ICD-10-CM | POA: Diagnosis not present

## 2020-07-26 DIAGNOSIS — E7800 Pure hypercholesterolemia, unspecified: Secondary | ICD-10-CM

## 2020-07-26 DIAGNOSIS — I10 Essential (primary) hypertension: Secondary | ICD-10-CM

## 2020-07-26 LAB — ECHOCARDIOGRAM COMPLETE
AR max vel: 0.61 cm2
AV Area VTI: 0.66 cm2
AV Area mean vel: 0.6 cm2
AV Mean grad: 14 mmHg
AV Peak grad: 27.7 mmHg
Ao pk vel: 2.63 m/s
Area-P 1/2: 2.5 cm2
Calc EF: 30.9 %
Height: 65 in
MV M vel: 4.76 m/s
MV Peak grad: 90.6 mmHg
Radius: 0.5 cm
S' Lateral: 4.3 cm
Single Plane A2C EF: 31.2 %
Single Plane A4C EF: 32.7 %
Weight: 2112.89 oz

## 2020-07-26 LAB — BASIC METABOLIC PANEL
Anion gap: 9 (ref 5–15)
BUN: 30 mg/dL — ABNORMAL HIGH (ref 8–23)
CO2: 24 mmol/L (ref 22–32)
Calcium: 8.6 mg/dL — ABNORMAL LOW (ref 8.9–10.3)
Chloride: 106 mmol/L (ref 98–111)
Creatinine, Ser: 1.12 mg/dL (ref 0.61–1.24)
GFR, Estimated: 59 mL/min — ABNORMAL LOW (ref >60)
Glucose, Bld: 102 mg/dL — ABNORMAL HIGH (ref 70–99)
Potassium: 4.1 mmol/L (ref 3.5–5.1)
Sodium: 139 mmol/L (ref 135–145)

## 2020-07-26 LAB — CBC
HCT: 30.9 % — ABNORMAL LOW (ref 39.0–52.0)
Hemoglobin: 10.2 g/dL — ABNORMAL LOW (ref 13.0–17.0)
MCH: 36.2 pg — ABNORMAL HIGH (ref 26.0–34.0)
MCHC: 33 g/dL (ref 30.0–36.0)
MCV: 109.6 fL — ABNORMAL HIGH (ref 80.0–100.0)
Platelets: 101 10*3/uL — ABNORMAL LOW (ref 150–400)
RBC: 2.82 MIL/uL — ABNORMAL LOW (ref 4.22–5.81)
RDW: 14.6 % (ref 11.5–15.5)
WBC: 3.3 10*3/uL — ABNORMAL LOW (ref 4.0–10.5)
nRBC: 0 % (ref 0.0–0.2)

## 2020-07-26 LAB — LIPID PANEL
Cholesterol: 136 mg/dL (ref 0–200)
HDL: 30 mg/dL — ABNORMAL LOW (ref >40)
LDL Cholesterol: 89 mg/dL (ref 0–99)
Total CHOL/HDL Ratio: 4.5 RATIO
Triglycerides: 85 mg/dL (ref <150)
VLDL: 17 mg/dL (ref 0–40)

## 2020-07-26 MED ORDER — CHLORHEXIDINE GLUCONATE CLOTH 2 % EX PADS: 6.0000 | MEDICATED_PAD | Freq: Every day | CUTANEOUS | Status: DC

## 2020-07-26 MED ORDER — FUROSEMIDE 20 MG PO TABS
20.0000 mg | ORAL_TABLET | Freq: Every morning | ORAL | Status: DC
  Administered 2020-07-27: 20 mg via ORAL
  Filled 2020-07-26 (×2): qty 1

## 2020-07-26 MED ORDER — PROSIGHT PO TABS
1.0000 | ORAL_TABLET | Freq: Two times a day (BID) | ORAL | Status: DC
  Administered 2020-07-26 – 2020-07-27 (×2): 1 via ORAL
  Filled 2020-07-26 (×4): qty 1

## 2020-07-26 MED ORDER — ASPIRIN EC 325 MG PO TBEC
325.0000 mg | DELAYED_RELEASE_TABLET | Freq: Once | ORAL | Status: AC
  Administered 2020-07-26: 325 mg via ORAL
  Filled 2020-07-26: qty 1

## 2020-07-26 MED ORDER — PANTOPRAZOLE SODIUM 40 MG PO TBEC
40.0000 mg | DELAYED_RELEASE_TABLET | Freq: Every day | ORAL | Status: DC
  Filled 2020-07-26 (×2): qty 1

## 2020-07-26 MED ORDER — TAMSULOSIN HCL 0.4 MG PO CAPS
0.4000 mg | ORAL_CAPSULE | Freq: Every day | ORAL | Status: DC
  Administered 2020-07-26: 0.4 mg via ORAL
  Filled 2020-07-26: qty 1

## 2020-07-26 MED ORDER — METOPROLOL TARTRATE 25 MG PO TABS
25.0000 mg | ORAL_TABLET | Freq: Two times a day (BID) | ORAL | Status: DC
  Administered 2020-07-26 – 2020-07-27 (×3): 25 mg via ORAL
  Filled 2020-07-26 (×3): qty 1

## 2020-07-26 MED ORDER — APIXABAN 2.5 MG PO TABS
2.5000 mg | ORAL_TABLET | Freq: Two times a day (BID) | ORAL | Status: DC
  Administered 2020-07-27: 2.5 mg via ORAL
  Filled 2020-07-26 (×2): qty 1

## 2020-07-26 MED ORDER — FLUTICASONE PROPIONATE 50 MCG/ACT NA SUSP: 2.0000 | Freq: Every day | NASAL | Status: DC | PRN

## 2020-07-26 NOTE — Evaluation (Signed)
Physical Therapy Evaluation & Discharge Patient Details Name: Alejandro Moore MRN: [id number] DOB: Feb 19, 1920 Today's Date: 07/26/2020   History of Present Illness  Pt is a 87 y.o. male who presented 4/16 with acute dysarthria and R arm numbness that resolved prior to arrival at hospital. No tPA was administered due to resolved symptoms and NIHSS of 0. Pt admitted for TIA workup. Of note, pt's symptoms returned later in hospital room so tPA was then administered. CT revealed no evidence of acute intracranial abnormality but chronic lacunar infarcts within the bil basal ganglia and severe cerebellar atrophy. CTA revealed calcified plaque results in moderate setnosis of bil paraclinoid ICAs and mild stenosis of bil V4 vertebral arteries. PMH:  AF (rate controlled, not on AC or platelet aggregators), CsHF, CKD III, HTN, CAD, macular degeneration, and GERD.    Clinical Impression  Pt presents with condition above. Pt appears to be functioning at his baseline. Pt provided min guard-supervision for gait on even surfaces without UE support, transfers, and stairs with 1 UE support this date but pt is capable of being mobile with mod I-independently. Pt does not appear to be at risk for falls, further supported by his DGI score of 22 this date. Bil UEs and lower extremities appears to be intact, WFL, and symmetrical when testing strength, coordination, and sensation. Pt reports his only recent change from baseline is increased blurry vision. Pt educated to have supervision initially going home for first couple days just to ensure safety but otherwise is safe to mobilize. All education completed and questions answered. PT will sign off at this time, thank you for this referral.     Follow Up Recommendations No PT follow up    Equipment Recommendations  None recommended by PT    Recommendations for Other Services       Precautions / Restrictions Precautions Precautions: Fall Restrictions Weight Bearing  Restrictions: No      Mobility  Bed Mobility Overal bed mobility: Independent             General bed mobility comments: Pt able to perform all bed mobility safely and quickly with bed flat and no use of rails.    Transfers Overall transfer level: Needs assistance Equipment used: None Transfers: Sit to/from [facility] Sit to Stand: Supervision Stand pivot transfers: Supervision       General transfer comment: Pt able to come to stand and transfer in appropriate time frame. Supervision for safety with one moment of slight instability stepping back to bed in tight space but able to recover without physical assistance.  Ambulation/Gait Ambulation/Gait assistance: Supervision;Min guard Gait Distance (Feet): 300 Feet Assistive device: None Gait Pattern/deviations: WFL(Within Functional Limits) Gait velocity: WFL Gait velocity interpretation: >2.62 ft/sec, indicative of community ambulatory (when cued to increase speed) General Gait Details: Pt with no asymmetry noted between his sides with gait and no overt LOB, except one minor change in gait when turning head to one side on first rep but then improved with subsequent reps. No LOB with challenges of DGI, min guard-supervision for safety. Pt able to change speeds and even did a little dance in the hall safely.  Stairs Stairs: Yes Stairs assistance: Min guard Stair Management: One rail Left;One rail Right;Alternating pattern;Forwards Number of Stairs: 10 General stair comments: Ascending with L rail and descending with R rail, demonstrating reciprocal gait pattern with min guard for safety and no overt LOB. Decreased speed when descending compared to ascending but possibly due to PT being anterior  to pt.  Wheelchair Mobility    Modified Rankin (Stroke Patients Only) Modified Rankin (Stroke Patients Only) Pre-Morbid Rankin Score: No symptoms Modified Rankin: No symptoms     Balance Overall balance  assessment: No apparent balance deficits (not formally assessed)                               Standardized Balance Assessment Standardized Balance Assessment : Dynamic Gait Index   Dynamic Gait Index Level Surface: Normal Change in Gait Speed: Normal Gait with Horizontal Head Turns: Mild Impairment Gait with Vertical Head Turns: Normal Gait and Pivot Turn: Normal Step Over Obstacle: Normal Step Around Obstacles: Normal Steps: Mild Impairment Total Score: 22       Pertinent Vitals/Pain Pain Assessment: No/denies pain    Home Living Family/patient expects to be discharged to:: Private residence Living Arrangements: Children (daughter and son-in-law) Available Help at Discharge: Family;Available 24 hours/day Type of Home: House Home Access: Stairs to enter Entrance Stairs-Rails: None Entrance Stairs-Number of Steps: 5 Home Layout: One level Home Equipment: Hand held shower head Additional Comments: Pt likes to tap dance!    Prior Function Level of Independence: Independent         Comments: Daughter manages his meds and drives him to appointments but he is otherwise independent with all functional mobility and ADLs without use of AD/AE.     Hand Dominance   Dominant Hand: Right    Extremity/Trunk Assessment   Upper Extremity Assessment Upper Extremity Assessment: Overall WFL for tasks assessed (intact, symmetrical, and WNL bil UE strength, coordination, and sensation noted with testing)    Lower Extremity Assessment Lower Extremity Assessment: Overall WFL for tasks assessed (intact, symmetrical, and WNL bil lower extremity strength, coordination, and sensation noted with testing)    Cervical / Trunk Assessment Cervical / Trunk Assessment: Kyphotic  Communication   Communication: No difficulties  Cognition Arousal/Alertness: Awake/alert Behavior During Therapy: WFL for tasks assessed/performed Overall Cognitive Status: Within Functional Limits  for tasks assessed                                 General Comments: A&Ox4.      General Comments General comments (skin integrity, edema, etc.): HR at rest in 70s and RR in 10-20s at rest; HR up to 120s with gait; BP stable throughout; increased SOB with standing activity but reports this is his normal    Exercises     Assessment/Plan    PT Assessment Patent does not need any further PT services  PT Problem List         PT Treatment Interventions      PT Goals (Current goals can be found in the Care Plan section)  Acute Rehab PT Goals Patient Stated Goal: to go home PT Goal Formulation: With patient Time For Goal Achievement: 07/27/20 Potential to Achieve Goals: Good    Frequency     Barriers to discharge        Co-evaluation               AM-PAC PT "6 Clicks" Mobility  Outcome Measure Help needed turning from your back to your side while in a flat bed without using bedrails?: None Help needed moving from lying on your back to sitting on the side of a flat bed without using bedrails?: None Help needed moving to and from a bed to  a chair (including a wheelchair)?: A Little Help needed standing up from a chair using your arms (e.g., wheelchair or bedside chair)?: A Little Help needed to walk in hospital room?: A Little Help needed climbing 3-5 steps with a railing? : A Little 6 Click Score: 20    End of Session Equipment Utilized During Treatment: Gait belt Activity Tolerance: Patient tolerated treatment well Patient left: in chair;with call bell/phone within reach (RN aware no chair alarm on or needed) Nurse Communication: Mobility status PT Visit Diagnosis: Other symptoms and signs involving the nervous system (R29.898)    Time: 4834-7583 PT Time Calculation (min) (ACUTE ONLY): 33 min   Charges:   PT Evaluation $PT Eval Low Complexity: 1 Low PT Treatments $Gait Training: 8-22 mins        Moishe Spice, PT, DPT Acute  Rehabilitation Services  Pager: [phone] Office: [place] 07/26/2020, 9:26 AM

## 2020-07-26 NOTE — Evaluation (Signed)
Occupational Therapy Evaluation Patient Details Name: Alejandro Moore MRN: [id number] DOB: Mar 31, 1920 Today's Date: 07/26/2020    History of Present Illness Pt is a 87 y.o. male who presented 4/16 with acute dysarthria and R arm numbness that resolved prior to arrival at hospital. No tPA was administered due to resolved symptoms and NIHSS of 0. Pt admitted for TIA workup. Of note, pt's symptoms returned later in hospital room so tPA was then administered. CT revealed no evidence of acute intracranial abnormality but chronic lacunar infarcts within the bil basal ganglia and severe cerebellar atrophy. MRI showed small acute cortically based infarcts within the Lt frontal lobe motor strip and adjacent Lt parietal lobe postcentral gyrus . PMH:  AF (rate controlled, not on AC or platelet aggregators), CsHF, CKD III, HTN, CAD, macular degeneration, and GERD.   Clinical Impression   Patient evaluated by Occupational Therapy with no further acute OT needs identified. All education has been completed and the patient has no further questions. Pt appears to be back to baseline and is able to perform ADLs mo I.   See below for any follow-up Occupational Therapy or equipment needs. OT is signing off. Thank you for this referral.      Follow Up Recommendations  No OT follow up;Supervision - Intermittent    Equipment Recommendations  None recommended by OT    Recommendations for Other Services       Precautions / Restrictions Precautions Precautions: None      Mobility Bed Mobility               General bed mobility comments: Pt sitting in recliner    Transfers Overall transfer level: Independent                    Balance Overall balance assessment: No apparent balance deficits (not formally assessed)                                         ADL either performed or assessed with clinical judgement   ADL Overall ADL's : Modified independent                                              Vision Baseline Vision/History: Macular Degeneration Patient Visual Report: No change from baseline Additional Comments: Pt with h/o macular degeneration.  He is unable to read, but able to make out objects.  He reports vision is baseline for him     Perception Perception Perception Tested?: Yes   Praxis Praxis Praxis tested?: Within functional limits    Pertinent Vitals/Pain Pain Assessment: No/denies pain     Hand Dominance Right   Extremity/Trunk Assessment Upper Extremity Assessment Upper Extremity Assessment: Overall WFL for tasks assessed   Lower Extremity Assessment Lower Extremity Assessment: Overall WFL for tasks assessed   Cervical / Trunk Assessment Cervical / Trunk Assessment: Kyphotic (mildly)   Communication Communication Communication: No difficulties   Cognition Arousal/Alertness: Awake/alert Behavior During Therapy: WFL for tasks assessed/performed Overall Cognitive Status: Within Functional Limits for tasks assessed                                 General Comments: grossly intact.   General Comments  Exercises     Shoulder Instructions      Home Living Family/patient expects to be discharged to:: Private residence Living Arrangements: Children Available Help at Discharge: Family;Available 24 hours/day Type of Home: House Home Access: Stairs to enter [employer] of Steps: 5 Entrance Stairs-Rails: None Home Layout: One level     Bathroom Shower/Tub: Occupational psychologist: Standard     Home Equipment: Hand held shower head          Prior Functioning/Environment Level of Independence: Independent        Comments: Pt does not drive due to macular degeneration.  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 He still enjoys tap dancing several times/week        OT Problem  List: Impaired vision/perception      OT Treatment/Interventions:      OT Goals(Current goals can be found in the care plan section) Acute Rehab OT Goals Patient Stated Goal: To go home OT Goal Formulation: With patient  OT Frequency:     Barriers to D/C:            Co-evaluation              AM-PAC OT "6 Clicks" Daily Activity     Outcome Measure Help from another person eating meals?: None Help from another person taking care of personal grooming?: None Help from another person toileting, which includes using toliet, bedpan, or urinal?: None Help from another person bathing (including washing, rinsing, drying)?: None Help from another person to put on and taking off regular upper body clothing?: None Help from another person to put on and taking off regular lower body clothing?: None 6 Click Score: 24   End of Session Nurse Communication: Mobility status  Activity Tolerance: Patient tolerated treatment well Patient left: in chair;with call bell/phone within reach  OT Visit Diagnosis: Low vision, both eyes (H54.2)                Time: 6283-1517 OT Time Calculation (min): 18 min Charges:  OT General Charges $OT Visit: 1 Visit OT Evaluation $OT Eval Low Complexity: 1 Low  Nilsa Nutting., OTR/L Acute Rehabilitation Services Pager [phone] Office [phone]   Lucille Passy M 07/26/2020, 3:44 PM

## 2020-07-26 NOTE — Evaluation (Signed)
Speech Language Pathology Evaluation Patient Details Name: Alejandro Moore MRN: [id number] DOB: [birth year] Today's Date: 07/26/2020 Time: 0930-     Problem List:  Patient Active Problem List   Diagnosis Date Noted  . Stroke (cerebrum) ([place]) 07/25/2020   Past Medical History: History reviewed. No pertinent past medical history. Past Surgical History:  Past Surgical History:  Procedure Laterality Date  . SHOULDER SURGERY     HPI:  Alejandro Moore is a 87 yo male with a PMHx of AF, CsHF, CKD III, HTN, CAD, macular degeneration, and GERD who presented via EMS as a code stroke secondary to slurred speech and right arm numbness.  Head CT on 4/16 was negative for acute abnormality, MRI ordered.   Assessment / Plan / Recommendation Clinical Impression  Alejandro Moore was seen for a cognitive-linguistic evaluation in the setting of a code stroke.  Alejandro Moore was encountered awake/alert in chair and was agreeable to this evaluation.  He reported that his slurred speech had resolved and that he believed that he had returned to his cognitive-linguistic baseline.  Alejandro Moore reported that he lives with his daughter and son-in-law and that they assist him with IALDs including medication and financial management.  Alejandro Moore completed portions of the [place] in addition to informal evaluation measures.  Alejandro Moore unable to complete visuospatial tasks on this date secondary to blurry vision.  Alejandro Moore exhibits mild cognitive deficits in the areas of short-term memory and numeric problem solving; however, suspect that these are baseline deficits.  No family present to establish a cognitive baseline.  Expressive/receptive language were [facility] and no dysarthria was observed.  Recommend continued assistance with IADLs at time of discharge.  No additional skilled ST is warranted at this time, please re-consult if additional needs arise.     SLP Assessment  SLP Recommendation/Assessment: Patient does not need any further Speech Lanaguage Pathology  Services SLP Visit Diagnosis: Cognitive communication deficit (R41.841)    Follow Up Recommendations  None          SLP Evaluation Cognition  Overall Cognitive Status: No family/caregiver present to determine baseline cognitive functioning Arousal/Alertness: Awake/alert Orientation Level: Oriented X4 Attention: Focused;Sustained Focused Attention: Appears intact Sustained Attention: Appears intact Memory: Impaired Memory Impairment: Decreased recall of new information;Decreased short term memory Decreased Short Term Memory: Verbal basic Awareness: Appears intact Problem Solving: Impaired Problem Solving Impairment: Verbal complex Safety/Judgment: Appears intact       Comprehension  Auditory Comprehension Overall Auditory Comprehension: Appears within functional limits for tasks assessed    Expression Expression Primary Mode of Expression: Verbal Verbal Expression Overall Verbal Expression: Appears within functional limits for tasks assessed Written Expression Dominant Hand: Right Written Expression: Not tested   Oral / Motor  Oral Motor/Sensory Function Overall Oral Motor/Sensory Function: Within functional limits Motor Speech Overall Motor Speech: Appears within functional limits for tasks assessed   GO                   Colin Mulders., M.S., [place] Office: [phone]  Elvia Collum [facility] 07/26/2020, 9:58 AM

## 2020-07-26 NOTE — Progress Notes (Addendum)
STROKE TEAM PROGRESS NOTE  Brief HPI:  Alejandro Moore is a 87 yo male with a PMHx of AF (rate controlled, not on AC or platelet aggregators), CsHF, CKD III, HTN, CAD, macular degeneration, and GERD who presented via EMS as a code stroke. Patient was sitting in a chair at home when he had acute onset of slurred speech and right arm numbness. This lasted for approximately 30 mins. Symptoms had resolved prior to arrival. BP 154/92 and CBG 109 in the field. Then he experienced recurrence of his symptoms for which tPA was given. Post tPA NIHSS=0. Enrolled in [employer] main trial.   INTERVAL HISTORY No visitors at bedside  Today he is sitting up in chair and reports he is feeling okay. He does not recall exactly why he chose not to take Eliquis. Chart review shows recommendation for Eliquis was given June 2021 by [facility] Cardiologist, Dr. Nehemiah Massed,. Unclear why he is not on it. He is awaiting therapy evaluations. Nursing reports stability and readiness for transfer out of ICU.   We discussed his stroke diagnosis, ongoing work up and plan of care. He would like to be discharged as soon as possible. Questions addressed.   Vitals:   07/26/20 0400 07/26/20 0600 07/26/20 0800 07/26/20 1200  BP: (!) 130/55  137/72 (!) 141/78  Pulse: 71 73 86 79  Resp: (!) 25 (!) 26  14  Temp: 98.3 F (36.8 C)  97.8 F (36.6 C) 98.1 F (36.7 C)  TempSrc: Oral  Oral Oral  SpO2: 94% 94% 93% 94%  Weight:      Height:       CBC:  Recent Labs  Lab 07/25/20 1310 07/25/20 1316 07/26/20 0324  WBC 4.8  --  3.3*  NEUTROABS 3.8  --   --   HGB 12.4* 13.3 10.2*  HCT 37.7* 39.0 30.9*  MCV 108.6*  --  109.6*  PLT 112*  --  201*   Basic Metabolic Panel:  Recent Labs  Lab 07/25/20 1310 07/25/20 1316 07/26/20 0324  NA 138 138 139  K 4.3 4.4 4.1  CL 103 103 106  CO2 29  --  24  GLUCOSE 108* 102* 102*  BUN 32* 41* 30*  CREATININE 1.27* 1.30* 1.12  CALCIUM 9.2  --  8.6*   Lipid Panel:  Recent Labs  Lab  07/26/20 0324  CHOL 136  TRIG 85  HDL 30*  CHOLHDL 4.5  VLDL 17  LDLCALC 89   HgbA1c: No results for input(s): HGBA1C in the last 168 hours. Urine Drug Screen: No results for input(s): LABOPIA, COCAINSCRNUR, LABBENZ, AMPHETMU, THCU, LABBARB in the last 168 hours.  Alcohol Level No results for input(s): ETH in the last 168 hours.  IMAGING/Diagnostics NCT head  No evidence of acute intracranial abnormality. Redemonstrated chronic lacunar infarcts within the bilateral basal ganglia. Background mild-to-moderate cerebral atrophy and moderate cerebralwhite matter chronic small vessel ischemic disease. Redemonstrated severe cerebellar atrophy.  CTA head: No LVO Calcified plaque results in moderate stenosis of the paraclinoidICAs bilaterally. Calcified plaque results in mild stenosis of the V4 vertebral arteries bilaterally. 1 mm vascular protrusion arising from the cavernous right ICA, which may reflect a small aneurysm  CTA neck: The bilateral common and internal carotid arteries are patent within the neck without hemodynamically significant stenosis. Mild atherosclerotic plaque within the carotid bifurcations bilaterally. Vertebral arteries patent within the neck. Mild atherosclerotic stenosis at the origin of the left vertebral artery. Partially imaged left pleural effusion. Interlobular septal thickening within the imaged lung  apices, nonspecific but possibly reflecting edema.   MR Brain without contrast: Small acute cortically based infarcts within the left frontal lobe motor strip (hand knob) and adjacent left parietal lobe postcentral gyrus. Small chronic cortical infarct within the right occipital lobe. Moderate to moderately severe cerebral white matter chronic small vessel ischemic disease. Mild-to-moderate cerebral atrophy with severe cerebellar atrophy. Mild paranasal sinus disease as described. Small bilateral mastoid effusions (greater on the right).  PHYSICAL  EXAM Physical Exam  Constitutional: Appears well-developed and well-nourished.  Psych: Affect appropriate to situation Eyes: No scleral injection, but obvious difficulty with sight on exam.  HENT: No OP obstrucion Head: Normocephalic.  Cardiovascular: Normal rate and regular rhythm.  Respiratory: Effort normal and breath sounds normal to anterior ascultation Skin: WDI  Neuro: Mental Status: A&Ox4 Patient is able to give a clear and coherent history. No signs of aphasia or neglect. Speech/Language: Cranial Nerves: II: Visual Fields are full. Pupils are equal, round, and reactive to light.  III,IV, VI: EOMI without ptosis or diploplia.  V: Facial sensation is symmetric to light touch.  VII: Facial movement is symmetric.  VIII: hearing is intact to voice X: Uvula elevates symmetrically XI: Shoulder shrug is symmetric. XII: tongue is midline without atrophy or fasciculations.  Motor: Tone is normal. Bulk is normal. 5/5 strength was present in all four extremities.  Sensory: Sensation is symmetric to light touch in the arms and legs.  Cerebellar: FNF and HKS are intact bilaterally.   ASSESSMENT/PLAN Alejandro Moore is a 87 yo male with a PMHx of AF (rate controlled, not on AC or platelet aggregators for unclear reasons), CsHF, CKD III, HTN, CAD, macular degeneration, and GERD who presented via EMS as a code stroke d/t acute onset of slurred speech and right arm numbness. This lasted for approximately 30 mins. Symptoms had resolved prior to arrival. BP 154/92 and CBG 109 in the field. Then he experienced recurrence of his symptoms for which tPA was given. Post tPA NIHSS=0. Enrolled in [employer] main trial.   Stroke - Small acute cortically based infarcts within the left frontal lobe motor strip and adjacent left parietal lobe postcentral gyrus. Small chronic cortical infarct within the right occipital lobe. Etiology may be due to persistent atrial fibrillation not on anticoagulation.    2D Echo pending  LDL 89  HgbA1c is pending as a send out.   VTE prophylaxis - SCDs   No antiplatelet or anticoagulation prior to admission, Currently on ASA 325mg  in s/p tPA setting. Recommend Eliquis 2.5 mg BID starting 4/18.   Therapy recommendations:  TBD  Disposition:  Pending, likely home  Persistent A. Fib  [facility] with Dr. Nehemiah Massed  Patient refused anticoagulating in the past if read of bleeding side effect  Discussed with patient again about and evaluation, he is in agreement for Eliquis  Will start Eliquis 2.5 mg twice daily tomorrow  Hypertension . BP < 180/105 after tPA . Stable, no need of cleviprex . Resume lasix and metoprolol . Long-term BP goal normotensive  Hyperlipidemia  Home meds:  None  LDL 89, goal < 70  Per 2021 cardiology notes they were abstaining from statins due to "medication effects". If patient is amenable could start low or moderate dose statin.   Other Stroke Risk Factors  Advanced Age >/= 84   CHF  Current occasional glass of wine   This plan of care was directed by Dr. Erlinda Hong.  Alejandro Blend, NP-C   Hospital day # 1  ATTENDING NOTE: I reviewed above note and agree with the assessment and plan. Pt was seen and examined.   87 year old male with history of CHF, persistent A. fib not on AC, HTN, CAD, CKD admitted for right arm numbness weakness and slurred speech.  Initially resolved in ED, CT negative, symptoms recurrent after CT.  Status post tPA.  CT head neck no LVO.  MRI showed left frontal motor strip small stroke.  2D echo pending LDL 89, A1c pending.  On exam, patient awake alert orientated, hard of hearing, however neurologically intact, no focal deficit.  No arm drift sensation symmetrical.  Etiology for patient stroke likely due to persistent A. fib not on AC.  Discussed with patient today, he is in agreement for Eliquis.  We will start Eliquis 2.5 mg twice daily tomorrow.  We will give  aspirin full dose today.  Patient was not on statin in the past given side effect.  We will discussed with him about low intensity statin tomorrow.  Patient has a history of CHF, 2D echo pending, resume Lasix and metoprolol.  Will request further records from [facility] Dr. Nehemiah Massed tomorrow.  For detailed assessment and plan, please refer to above as I have made changes wherever appropriate.   Rosalin Hawking, MD PhD Stroke Neurology 07/26/2020 6:34 PM  This patient is critically ill due to left frontal stroke status post tPA, CHF, persistent A. fib and at significant risk of neurological worsening, death form recurrent stroke, hemorrhagic conversion, bleeding from tPA, heart failure, seizure. This patient's care requires constant monitoring of vital signs, hemodynamics, respiratory and cardiac monitoring, review of multiple databases, neurological assessment, discussion with family, other specialists and medical decision making of high complexity. I spent 40 minutes of neurocritical care time in the care of this patient.   To contact Stroke Continuity provider, please refer to [email or web address] After hours, contact General Neurology

## 2020-07-27 DIAGNOSIS — I255 Ischemic cardiomyopathy: Secondary | ICD-10-CM

## 2020-07-27 DIAGNOSIS — I482 Chronic atrial fibrillation, unspecified: Secondary | ICD-10-CM | POA: Diagnosis not present

## 2020-07-27 DIAGNOSIS — I639 Cerebral infarction, unspecified: Secondary | ICD-10-CM | POA: Diagnosis not present

## 2020-07-27 DIAGNOSIS — E7800 Pure hypercholesterolemia, unspecified: Secondary | ICD-10-CM | POA: Diagnosis not present

## 2020-07-27 LAB — BASIC METABOLIC PANEL
Anion gap: 9 (ref 5–15)
BUN: 26 mg/dL — ABNORMAL HIGH (ref 8–23)
CO2: 25 mmol/L (ref 22–32)
Calcium: 8.7 mg/dL — ABNORMAL LOW (ref 8.9–10.3)
Chloride: 105 mmol/L (ref 98–111)
Creatinine, Ser: 1.12 mg/dL (ref 0.61–1.24)
GFR, Estimated: 59 mL/min — ABNORMAL LOW (ref >60)
Glucose, Bld: 102 mg/dL — ABNORMAL HIGH (ref 70–99)
Potassium: 4.2 mmol/L (ref 3.5–5.1)
Sodium: 139 mmol/L (ref 135–145)

## 2020-07-27 LAB — CBC
HCT: 34.7 % — ABNORMAL LOW (ref 39.0–52.0)
Hemoglobin: 11.7 g/dL — ABNORMAL LOW (ref 13.0–17.0)
MCH: 36.3 pg — ABNORMAL HIGH (ref 26.0–34.0)
MCHC: 33.7 g/dL (ref 30.0–36.0)
MCV: 107.8 fL — ABNORMAL HIGH (ref 80.0–100.0)
Platelets: 104 10*3/uL — ABNORMAL LOW (ref 150–400)
RBC: 3.22 MIL/uL — ABNORMAL LOW (ref 4.22–5.81)
RDW: 14.5 % (ref 11.5–15.5)
WBC: 3.3 10*3/uL — ABNORMAL LOW (ref 4.0–10.5)
nRBC: 0.9 % — ABNORMAL HIGH (ref 0.0–0.2)

## 2020-07-27 LAB — HEMOGLOBIN A1C
Hgb A1c MFr Bld: 6 % — ABNORMAL HIGH (ref 4.8–5.6)
Mean Plasma Glucose: 126 mg/dL

## 2020-07-27 LAB — VITAMIN B12: Vitamin B-12: 289 pg/mL (ref 180–914)

## 2020-07-27 LAB — FOLATE: Folate: 30.5 ng/mL (ref >5.9)

## 2020-07-27 MED ORDER — CYANOCOBALAMIN 1000 MCG PO TABS: 1000.0000 ug | ORAL_TABLET | Freq: Every day | ORAL | 5 refills | Status: DC

## 2020-07-27 MED ORDER — VITAMIN B-12 1000 MCG PO TABS
1000.0000 ug | ORAL_TABLET | Freq: Every day | ORAL | Status: DC
  Administered 2020-07-27: 1000 ug via ORAL
  Filled 2020-07-27: qty 1

## 2020-07-27 MED ORDER — APIXABAN 2.5 MG PO TABS: 2.5000 mg | ORAL_TABLET | Freq: Two times a day (BID) | ORAL | 1 refills | Status: DC

## 2020-07-27 MED ORDER — ATORVASTATIN CALCIUM 40 MG PO TABS: 40.0000 mg | ORAL_TABLET | Freq: Every day | ORAL | 11 refills | Status: DC

## 2020-07-27 NOTE — TOC Benefit Eligibility Note (Signed)
Transition of Care [facility]) Benefit Eligibility Note    Patient Details  Name: Alejandro Moore MRN: [id number] Date of Birth: [birth year]   Medication/Dose: Arne Cleveland  2.5 MG  BID    OR    5 MG BID CO-PAY- $47.00  Covered?: Yes  Tier: 3 Drug  Prescription Coverage Preferred Pharmacy: [facility]  Spoke with Person/Company/Phone Number:: LISS  @ [facility] RX # [phone]  Co-Pay: $ 47.00  Prior Approval: No  Deductible:  (NO DEDUCTIBLE WITH PLAN  /  OUT-OF-POCKET:UNMET)       Memory Argue Phone Number: 07/27/2020, 2:34 PM

## 2020-07-27 NOTE — TOC Transition Note (Signed)
Transition of Care [facility]) - CM/SW Discharge Note   Patient Details  Name: Alejandro Moore MRN: [id number] Date of Birth: Aug 06, 1919  Transition of Care [facility]) CM/SW Contact:  Ella Bodo, RN Phone Number: 07/27/2020, 5:21 PM   Clinical Narrative:  Pt is a 87 y.o. male who presented 4/16 with acute dysarthria and R arm numbness that resolved prior to arrival at hospital. No tPA was administered due to resolved symptoms and NIHSS of 0. Pt admitted for TIA workup. Of note, pt's symptoms returned later in hospital room so tPA was then administered. CT revealed no evidence of acute intracranial abnormality but chronic lacunar infarcts within the bil basal ganglia and severe cerebellar atrophy. MRI showed small acute cortically based infarcts within the Lt frontal lobe motor strip and adjacent Lt parietal lobe postcentral gyrus .   PTA, pt independent and living at home with adult children; they can provide 24h assistance at dc.  PT/OT recommending no OP follow up. Pt discharging on Eliquis; 30 day free trial card given to patient.  No other dc needs identified.     Final next level of care: Home/Self Care Barriers to Discharge: No Barriers Identified   Patient Goals and CMS Choice Patient states their goals for this hospitalization and ongoing recovery are:: to go home                            Discharge Plan and Services   Discharge Planning Services: CM Consult,Medication Assistance                                 Social Determinants of Health (SDOH) Interventions     Readmission Risk Interventions No flowsheet data found.  Reinaldo Raddle, RN, BSN  Trauma/Neuro ICU Case Manager [phone]

## 2020-07-27 NOTE — Discharge Summary (Addendum)
Stroke Discharge Summary  Patient ID: Alejandro Moore   MRN: [id number]      DOB: 07-18-1919  Date of Admission: 07/25/2020 Date of Discharge: 07/27/2020  Attending Physician:  Rosalin Hawking, MD, Stroke MD Consultant(s):    Patient's PCP:  Idelle Crouch, MD  DISCHARGE DIAGNOSIS:  1. Small acute cortically based infarcts within the left frontal lobe motor strip and adjacent left parietal lobe postcentral gyrus. Small chronic cortical infarct within the right occipital lobe. Etiology may be due to persistent atrial fibrillation not on anticoagulation.  2. Hypertension 3. Hyperlipidemia 4. Vit B12 deficiency   Allergies as of 07/27/2020   No Known Allergies     Medication List    TAKE these medications   apixaban 2.5 MG Tabs tablet Commonly known as: ELIQUIS Take 1 tablet (2.5 mg total) by mouth 2 (two) times daily.   atorvastatin 40 MG tablet Commonly known as: Lipitor Take 1 tablet (40 mg total) by mouth daily.   cyanocobalamin 1000 MCG tablet Take 1 tablet (1,000 mcg total) by mouth daily.   esomeprazole 20 MG capsule Commonly known as: NEXIUM Take 20 mg by mouth every other day.   fluticasone 50 MCG/ACT nasal spray Commonly known as: FLONASE Place 2 sprays into both nostrils daily as needed for allergies or rhinitis.   furosemide 20 MG tablet Commonly known as: LASIX TAKE 1 TABLET BY MOUTH EVERY DAY What changed: when to take this   metoprolol tartrate 25 MG tablet Commonly known as: LOPRESSOR Take 25 mg by mouth 2 (two) times daily.   OVER THE COUNTER MEDICATION Take 1 packet by mouth every morning. Multivitamin pak - [employer]   oxymetazoline 0.05 % nasal spray Commonly known as: AFRIN Place 1 spray into both nostrils 2 (two) times daily as needed (nose bleeds).   potassium chloride 10 MEQ tablet Commonly known as: KLOR-CON TAKE 1 TABLET BY MOUTH EVERY DAY What changed: when to take this   PreserVision AREDS 2 Caps Take 1  capsule by mouth 2 (two) times daily.   tamsulosin 0.4 MG Caps capsule Commonly known as: FLOMAX Take 0.4 mg by mouth daily after supper.       LABORATORY STUDIES CBC    Component Value Date/Time   WBC 3.3 (L) 07/27/2020 0453   RBC 3.22 (L) 07/27/2020 0453   HGB 11.7 (L) 07/27/2020 0453   HCT 34.7 (L) 07/27/2020 0453   PLT 104 (L) 07/27/2020 0453   MCV 107.8 (H) 07/27/2020 0453   MCH 36.3 (H) 07/27/2020 0453   MCHC 33.7 07/27/2020 0453   RDW 14.5 07/27/2020 0453   LYMPHSABS 0.5 (L) 07/25/2020 1310   MONOABS 0.5 07/25/2020 1310   EOSABS 0.0 07/25/2020 1310   BASOSABS 0.0 07/25/2020 1310   CMP    Component Value Date/Time   NA 139 07/27/2020 0453   K 4.2 07/27/2020 0453   CL 105 07/27/2020 0453   CO2 25 07/27/2020 0453   GLUCOSE 102 (H) 07/27/2020 0453   BUN 26 (H) 07/27/2020 0453   CREATININE 1.12 07/27/2020 0453   CALCIUM 8.7 (L) 07/27/2020 0453   PROT 6.8 07/25/2020 1310   ALBUMIN 3.4 (L) 07/25/2020 1310   AST 26 07/25/2020 1310   ALT 28 07/25/2020 1310   ALKPHOS 98 07/25/2020 1310   BILITOT 1.1 07/25/2020 1310   GFRNONAA 59 (L) 07/27/2020 0453   GFRAA 54 (L) 08/26/2019 1936   COAGS Lab Results  Component Value Date   INR 1.2 07/25/2020  Lipid Panel    Component Value Date/Time   CHOL 136 07/26/2020 0324   TRIG 85 07/26/2020 0324   HDL 30 (L) 07/26/2020 0324   CHOLHDL 4.5 07/26/2020 0324   VLDL 17 07/26/2020 0324   LDLCALC 89 07/26/2020 0324   HgbA1C  Lab Results  Component Value Date   HGBA1C 6.0 (H) 07/26/2020   Urinalysis No results found for: COLORURINE, APPEARANCEUR, LABSPEC, PHURINE, GLUCOSEU, HGBUR, BILIRUBINUR, KETONESUR, PROTEINUR, UROBILINOGEN, NITRITE, LEUKOCYTESUR Urine Drug Screen No results found for: LABOPIA, COCAINSCRNUR, LABBENZ, AMPHETMU, THCU, LABBARB  Alcohol Level No results found for: [facility]   SIGNIFICANT DIAGNOSTIC STUDIES  No evidence of acute intracranial abnormality. Redemonstrated chronic lacunar infarcts within  the bilateral basal ganglia. Background mild-to-moderate cerebral atrophy and moderate cerebralwhite matter chronic small vessel ischemic disease. Redemonstrated severe cerebellar atrophy.  CTA head: No LVO Calcified plaque results in moderate stenosis of the paraclinoidICAs bilaterally. Calcified plaque results in mild stenosis of the V4 vertebral arteries bilaterally. 1 mm vascular protrusion arising from the cavernous right ICA, which may reflect a small aneurysm  CTA neck: The bilateral common and internal carotid arteries are patent within the neck without hemodynamically significant stenosis. Mild atherosclerotic plaque within the carotid bifurcations bilaterally. Vertebral arteries patent within the neck. Mild atherosclerotic stenosis at the origin of the left vertebral artery. Partially imaged left pleural effusion. Interlobular septal thickening within the imaged lung apices, nonspecific but possibly reflecting edema.   MR Brain without contrast: Small acute cortically based infarcts within the left frontal lobe motor strip (hand knob) and adjacent left parietal lobe postcentral gyrus. Small chronic cortical infarct within the right occipital lobe. Moderate to moderately severe cerebral white matter chronic small vessel ischemic disease. Mild-to-moderate cerebral atrophy with severe cerebellar atrophy. Mild paranasal sinus disease as described. Small bilateral mastoid effusions (greater on the right).  HISTORY OF PRESENT ILLNESS Alejandro Moore is a 87 yo male with a PMHx of AF (rate controlled, not on AC or platelet aggregators for unclear reasons), CsHF, CKD III, HTN, CAD, macular degeneration, and GERD who presented via EMS as a code stroke d/t acute onset of slurred speech and right arm numbness. This lasted for approximately 30 mins. Symptoms had resolved prior to arrival. BP 154/92 and CBG 109 in the field. Then he experienced recurrence of his symptoms for which tPA was  given. Post tPA NIHSS=0. Enrolled in [employer] main trial.   HOSPITAL COURSE Alejandro Moore was admitted to the neurologic ICU for intensive post tPA monitoring. There he remained neurologically and hemodynamically stable. He made good progress, was cleared for discharged home by the therapy teams. His hospital problems list as below:   Stroke - Small acute cortically based infarcts within the left frontal lobe motor strip and adjacent left parietal lobe postcentral gyrus. Small chronic cortical infarct within the right occipital lobe. Etiology may be due to persistent atrial fibrillation not on anticoagulation.    2D Echo showing EF 25% which is slightly changed from last Echo at [facility] June 2021 with EF of 30%  LDL 89  VTE prophylaxis - SCDs   No antiplatelet or anticoagulation prior to admission, Currently on ASA 325mg  in s/p tPA setting. Recommend Eliquis 2.5 mg BID (age > 6 and wt < 60kg) starting 4/18.   Therapy recommendations:  Cleared for home without further needs  Disposition:  Home without needs  Persistent A. Fib  Follows [facility] with Dr. Nehemiah Massed  Patient refused anticoagulating in the past d/t bleeding side effect  Discussed with patient again about and evaluation, he is in agreement for Eliquis  Eliquis 2.5 mg twice daily, started today   Cardiomyopathy  09/14/2019 EF 30%  This admission EF 25%  Put on Eliquis 2.5 twice daily  Patient will continue follow-up with Dr. Nehemiah Massed at [place] [facility].  Next appointment on 08/17/2020.  Hypertension  BP < 180/105 after tPA  Stable, no need of cleviprex  Resume lasix and metoprolol  Long-term BP goal normotensive  Hyperlipidemia  Home meds:  None  LDL 89, goal < 70  Per 2021 cardiology notes they were abstaining from statins due to "medication effects". Patient was agreeable to starting Lipitor 40mg  daily.   Other Stroke Risk Factors  Advanced Age >/= 17   CHF  Current  occasional glass of wine   DISCHARGE EXAM Blood pressure (!) 118/56, pulse 65, temperature 98.7 F (37.1 C), temperature source Oral, resp. rate 15, height 5\' 5"  (1.651 m), weight 59.9 kg, SpO2 97 %.  Physical Exam Constitutional: Appears well-developed and well-nourished.  Psych: Affect appropriate to situation Eyes: No scleral injection, but obvious difficulty with sight on exam. HENT: No OP obstrucion Head: Normocephalic.  Cardiovascular: Normal rate and regular rhythm.  Respiratory: Effort normal and breath sounds normal to anterior ascultation Skin: WDI  Neuro: Mental Status: A&Ox4 Patient is able to give a clear and coherent history. No signs of aphasia or neglect. Speech/Language: Cranial Nerves: II: Visual Fields are full. Pupils are equal, round, and reactive to light.  III,IV, VI: EOMI without ptosis or diploplia.  V: Facial sensation is symmetric tolight touch. VII: Facial movement is symmetric.  VIII: hearing is intact to voice X: Uvula elevates symmetrically XI: Shoulder shrug is symmetric. XII: tongue is midline without atrophy or fasciculations.  Motor: Tone is normal. Bulk is normal. 5/5 strength was present in all four extremities.  Sensory: Sensation is symmetric to light touch in the arms and legs. Cerebellar: FNF and HKS are intact bilaterally  Discharge Diet       Diet   Diet Heart Room service appropriate? Yes; Fluid consistency: Thin    DISCHARGE PLAN  Disposition:  Home  Eliquis 2.5mg  BID  Ongoing stroke risk factor control by Primary Care Physician at time of discharge  Follow-up PCP Idelle Crouch, MD in 2 weeks.  Follow-up in [facility] in 4 weeks, office to schedule an appointment.   30 minutes were spent preparing discharge.  Delila A Bailey-Modzik, NP-C, MSN  ATTENDING NOTE: I reviewed above note and agree with the assessment and plan. Pt was seen and examined.   Patient doing well,  no acute event overnight.  Patient sitting in chair, no family at bedside.  Stated no complaints.  Patient neurologically intact at this time.  EF 25%, slightly decreased from 09/2019 which was 30%.  Patient has follow-up with Dr. Nehemiah Massed cardiologist at [place] [facility] on 08/17/2020.  Started on Eliquis 2.5 twice daily (age 55 and wt 59.9) this morning.  Patient tolerating well.  Patient was also put on statin on discharge.  Elevated MCV and MCH, FA within normal limits, B12 on the lower end.  We will give B12 supplement.  Patient is ready for discharge, will follow-up at [facility] stroke clinic in 4 weeks.  For detailed assessment and plan, please refer to above as I have made changes wherever appropriate.   Rosalin Hawking, MD PhD Stroke Neurology 07/27/2020 2:47 PM

## 2020-08-04 ENCOUNTER — Ambulatory Visit: Payer: Medicare Other | Admitting: Podiatry

## 2020-08-06 ENCOUNTER — Encounter: Payer: Self-pay | Admitting: Podiatry

## 2020-08-06 ENCOUNTER — Ambulatory Visit (INDEPENDENT_AMBULATORY_CARE_PROVIDER_SITE_OTHER): Payer: Medicare Other | Admitting: Podiatry

## 2020-08-06 ENCOUNTER — Other Ambulatory Visit: Payer: Self-pay

## 2020-08-06 DIAGNOSIS — M79674 Pain in right toe(s): Secondary | ICD-10-CM

## 2020-08-06 DIAGNOSIS — M79675 Pain in left toe(s): Secondary | ICD-10-CM

## 2020-08-06 DIAGNOSIS — B351 Tinea unguium: Secondary | ICD-10-CM

## 2020-08-06 NOTE — Progress Notes (Signed)
  Subjective:  Patient ID: Alejandro Moore, male    DOB: 21-May-1919,  MRN: [id number]  Chief Complaint  Patient presents with  . Nail Problem    Nail trim RFC   87 y.o. male returns for the above complaint.  Patient presents with thickened elongated dystrophic toenails x10.  Painful to touch.  Patient would like to have them debrided down.  He denies any other acute complaints.  He has not seen anyone else prior to seeing me.  He is not a diabetic.  Objective:  There were no vitals filed for this visit. Podiatric Exam: Vascular: dorsalis pedis and posterior tibial pulses are palpable bilateral. Capillary return is immediate. Temperature gradient is WNL. Skin turgor WNL  Sensorium: Normal Semmes Weinstein monofilament test. Normal tactile sensation bilaterally. Nail Exam: Pt has thick disfigured discolored nails with subungual debris noted bilateral entire nail hallux through fifth toenails.  Pain on palpation to the nails. Ulcer Exam: There is no evidence of ulcer or pre-ulcerative changes or infection. Orthopedic Exam: Muscle tone and strength are WNL. No limitations in general ROM. No crepitus or effusions noted. HAV  B/L.  Hammer toes 2-5  B/L. Skin: No Porokeratosis. No infection or ulcers    Assessment & Plan:   1. Pain due to onychomycosis of toenails of both feet     Patient was evaluated and treated and all questions answered.  Onychomycosis with pain  -Nails palliatively debrided as below. -Educated on self-care  Procedure: Nail Debridement Rationale: pain  Type of Debridement: manual, sharp debridement. Instrumentation: Nail nipper, rotary burr. Number of Nails: 10  Procedures and Treatment: Consent by patient was obtained for treatment procedures. The patient understood the discussion of treatment and procedures well. All questions were answered thoroughly reviewed. Debridement of mycotic and hypertrophic toenails, 1 through 5 bilateral and clearing of subungual  debris. No ulceration, no infection noted.  Return Visit-Office Procedure: Patient instructed to return to the office for a follow up visit 3 months for continued evaluation and treatment.  Boneta Lucks, DPM    No follow-ups on file.

## 2020-09-21 ENCOUNTER — Telehealth: Payer: Self-pay | Admitting: Adult Health

## 2020-09-21 ENCOUNTER — Inpatient Hospital Stay: Payer: Medicare Other | Admitting: Adult Health

## 2020-09-21 NOTE — Telephone Encounter (Signed)
Hospital f/u cancelled due to np out sick.

## 2020-10-22 ENCOUNTER — Other Ambulatory Visit: Payer: Self-pay

## 2020-10-22 NOTE — Patient Outreach (Signed)
[facility]) Care Management  10/22/2020  Alejandro Moore 1920/03/06 [id number]    First telephone outreach attempt to obtain mRS. No answer. Left message for returned call.  Philmore Pali [facility] Management Assistant [phone]

## 2020-10-29 ENCOUNTER — Other Ambulatory Visit: Payer: Self-pay

## 2020-10-29 NOTE — Patient Outreach (Signed)
[facility]) Care Management  10/29/2020  Randy Whitus 1919-10-01 [id number]   Second telephone outreach attempt to obtain mRS. No answer. Left message for returned call.  Philmore Pali [facility] Management Assistant [phone]

## 2020-11-02 ENCOUNTER — Ambulatory Visit (INDEPENDENT_AMBULATORY_CARE_PROVIDER_SITE_OTHER): Payer: Medicare Other | Admitting: Adult Health

## 2020-11-02 ENCOUNTER — Encounter: Payer: Self-pay | Admitting: Adult Health

## 2020-11-02 VITALS — BP 124/73 | HR 69 | Ht 65.0 in | Wt 135.4 lb

## 2020-11-02 DIAGNOSIS — E785 Hyperlipidemia, unspecified: Secondary | ICD-10-CM | POA: Diagnosis not present

## 2020-11-02 DIAGNOSIS — I10 Essential (primary) hypertension: Secondary | ICD-10-CM | POA: Diagnosis not present

## 2020-11-02 DIAGNOSIS — I639 Cerebral infarction, unspecified: Secondary | ICD-10-CM

## 2020-11-02 DIAGNOSIS — I480 Paroxysmal atrial fibrillation: Secondary | ICD-10-CM

## 2020-11-02 NOTE — Patient Instructions (Signed)
Continue Eliquis (apixaban) daily  and atorvastatin  for secondary stroke prevention  Continue to follow with cardiology for atrial fibrillation and eliquis management  Continue to follow up with PCP regarding cholesterol and blood pressure management  Maintain strict control of hypertension with blood pressure goal below 130/90 and cholesterol with LDL cholesterol (bad cholesterol) goal below 70 mg/dL.       Followup in the future with me in 6 months or call earlier if needed       Thank you for coming to see [place] at [facility]. I hope we have been able to provide you high quality care today.  You may receive a patient satisfaction survey over the next few weeks. We would appreciate your feedback and comments so that we may continue to improve ourselves and the health of our patients.

## 2020-11-02 NOTE — Progress Notes (Signed)
[facility] [address] [place]. [place] [address] [phone]       HOSPITAL FOLLOW UP NOTE  [name] Date of Birth:  Mar 06, 1920 Medical Record Number:  [id number]   Reason for Referral:  hospital stroke follow up    SUBJECTIVE:   CHIEF COMPLAINT:  Chief Complaint  Patient presents with   Cerebrovascular Accident    Rm [place], dgtr- Alejandro Moore  "doing well"    HPI:   Alejandro Moore is a 87 yo male with a PMHx of AF (rate controlled, not on [facility] or platelet aggregators for unclear reasons), CsHF, CKD III, HTN, CAD, macular degeneration, and GERD who presented via EMS on 07/25/2020 as a code stroke d/t acute onset of slurred speech and right arm numbness. This lasted for approximately 30 mins. Symptoms had resolved prior to arrival. BP 154/92 and CBG 109 in the field. Then he experienced recurrence of his symptoms for which tPA was given. Post tPA NIHSS=0. Enrolled in OPTIMIST main trial.  Personally reviewed hospitalization pertinent progress notes, lab work and imaging with summary provided.  Evaluated by Dr. Erlinda Hong with stroke work-up revealing small acute cortically based infarcts within the left frontal lobe motor strip and adjacent left parietal lobe postcentral gyrus as well as small chronic cortical infarcts in the right occipital lobe, etiology likely due to persistent A. fib not on AC (per note, pt previously refused due to bleeding side effect concerns).  Recommend initiating Eliquis 2.5 mg twice daily (age and weight) for secondary stroke prevention.  Known cardiomyopathy with EF 25% routinely followed by cardiology.  LDL 89 and start atorvastatin 40 mg daily (previously declined statins due to medication effects).  Other stroke risk factors include HTN, B12 deficiency placed on supplementation, advanced age and CHF.  Cleared by therapies and discharged home in stable condition.  Today, 11/02/2020, Alejandro Moore is being seen for hospital follow-up  accompanied by his daughter, Alejandro Moore.  He has been doing well since discharge without new or reoccurring stroke/TIA symptoms.  He has returned back to prior level of functioning.  Continues to live with his daughter who assists with IADLs but is able to maintain ADLs independently.  Remains on Eliquis -reports occasional nosebleeds lasting only short duration without intervention.  He does report chronic history of nosebleeds requiring cauterization which slightly worsened since starting Eliquis.  No other bleeding or bruising concerns.  Remains on atorvastatin 40 mg daily without myalgias or any other side effects.  Blood pressure today 124/73.  No concerns at this time.      ROS:   14 system review of systems performed and negative with exception of those listed in HPI  PMH:  Past Medical History:  Diagnosis Date   BPH (benign prostatic hyperplasia)    CVA (cerebral vascular accident) ([place])    Macular degeneration     PSH:  Past Surgical History:  Procedure Laterality Date   KNEE SURGERY     SHOULDER SURGERY      Social History:  Social History   Socioeconomic History   Marital status: Widowed    Spouse name: Not on file   Number of children: 2   Years of education: Not on file   Highest education level: Associate degree: occupational, Hotel manager, or vocational program  Occupational History   Not on file  Tobacco Use   Smoking status: Never   Smokeless tobacco: Never  Vaping Use   Vaping Use: Never used  Substance and Sexual Activity  Alcohol use: Never   Drug use: Never   Sexual activity: Not on file  Other Topics Concern   Not on file  Social History Narrative   11/02/20 lives with daughter   Social Determinants of Health   Financial Resource Strain: Not on file  Food Insecurity: Not on file  Transportation Needs: Not on file  Physical Activity: Not on file  Stress: Not on file  Social Connections: Not on file  Intimate Partner Violence: Not on file     Family History: No family history on file.  Medications:   Current Outpatient Medications on File Prior to Visit  Medication Sig Dispense Refill   apixaban (ELIQUIS) 2.5 MG TABS tablet Take 1 tablet (2.5 mg total) by mouth 2 (two) times daily. 60 tablet 1   atorvastatin (LIPITOR) 40 MG tablet Take 1 tablet (40 mg total) by mouth daily. 30 tablet 11   esomeprazole (NEXIUM) 20 MG capsule Take 20 mg by mouth every other day.     fluticasone (FLONASE) 50 MCG/ACT nasal spray Place 2 sprays into both nostrils daily as needed for allergies or rhinitis.     furosemide (LASIX) 20 MG tablet TAKE 1 TABLET BY MOUTH EVERY DAY (Patient taking differently: Take 20 mg by mouth every morning.) 90 tablet 3   metoprolol tartrate (LOPRESSOR) 25 MG tablet Take 25 mg by mouth 2 (two) times daily.     Multiple Vitamins-Minerals (PRESERVISION AREDS 2) CAPS Take 1 capsule by mouth 2 (two) times daily.     OVER THE COUNTER MEDICATION Take 1 packet by mouth every morning. Multivitamin pak - [employer]     oxymetazoline (AFRIN) 0.05 % nasal spray Place 1 spray into both nostrils 2 (two) times daily as needed (nose bleeds).     potassium chloride (KLOR-CON) 10 MEQ tablet TAKE 1 TABLET BY MOUTH EVERY DAY (Patient taking differently: Take 10 mEq by mouth every morning.) 90 tablet 3   tamsulosin (FLOMAX) 0.4 MG CAPS capsule Take 0.4 mg by mouth daily after supper.     vitamin B-12 1000 MCG tablet Take 1 tablet (1,000 mcg total) by mouth daily. 30 tablet 5   No current facility-administered medications on file prior to visit.    Allergies:  No Known Allergies    OBJECTIVE:  Physical Exam  Vitals:   11/02/20 1301  BP: 124/73  Pulse: 69  Weight: 135 lb 6.4 oz (61.4 kg)  Height: '5\' 5"'$  (1.651 m)   Body mass index is 22.53 kg/m. No results found.  Post stroke PHQ 2/9 Depression screen PHQ 2/9 11/02/2020  Decreased Interest 0  Down, Depressed, Hopeless 0  PHQ - 2 Score 0     General:  well developed, well nourished, very pleasant elderly Caucasian male, seated, in no evident distress Head: head normocephalic and atraumatic.   Neck: supple with no carotid or supraclavicular bruits Cardiovascular: regular rate and rhythm, no murmurs Musculoskeletal: no deformity Skin:  no rash/petichiae Vascular:  Normal pulses all extremities   Neurologic Exam Mental Status: Awake and fully alert.  Fluent speech and language.  Oriented to place and time. Recent and remote memory intact. Attention span, concentration and fund of knowledge appropriate. Mood and affect appropriate.  Cranial Nerves: Fundoscopic exam reveals sharp disc margins. Pupils equal, briskly reactive to light. Extraocular movements full without nystagmus. Visual fields full to confrontation. Hearing intact. Facial sensation intact. Face, tongue, palate moves normally and symmetrically.  Motor: Normal bulk and tone. Normal strength in all tested extremity  muscles Sensory.: intact to touch , pinprick , position and vibratory sensation.  Coordination: Rapid alternating movements normal in all extremities. Finger-to-nose and heel-to-shin performed accurately bilaterally. Gait and Station: Arises from chair without difficulty. Stance is normal. Gait demonstrates normal stride length and balance without use of assistive device. Reflexes: 1+ and symmetric. Toes downgoing.     NIHSS  0 Modified Rankin  0      ASSESSMENT: Alejandro Moore is a 87 y.o. year old male with recent small acute cortically based infarcts in left frontal lobe and adjacent left parietal lobe postcentral gyrus infarcts on 07/25/2020 s/p tPA, etiology likely persistent A. fib not on AC. Vascular risk factors include AF not on AC (pt previously declined), chronic right occipital lobe infarct on imaging, HTN, HLD, cardiomyopathy, CHF and advanced age.      PLAN:  Embolic stroke: Recovered well without residual deficit.  Continue Eliquis (apixaban)  daily 2.5 mg twice daily and atorvastatin 40 mg daily for secondary stroke prevention.  Discussed secondary stroke prevention measures and importance of close PCP follow up for aggressive stroke risk factor management. I have gone over the pathophysiology of stroke, warning signs and symptoms, risk factors and their management in some detail with instructions to go to the closest emergency room for symptoms of concern. A. fib: Continue Eliquis 2.5 mg twice daily (age and weight) for CHA2DS2-VASc score of at least 6 routinely followed by cardiology for monitoring and management HTN: BP goal <130/90.  Stable on current regimen per PCP HLD: LDL goal <70. Recent LDL 89 -continue atorvastatin 40 mg daily.  Request follow-up with PCP in the next 1 to 2 months for repeat lipid panel and ongoing prescribing of statin    Follow up in 6 months or call earlier if needed   CC:  [facility] provider: Dr. Leonie Man PCP: Idelle Crouch, MD    I spent 46 minutes of face-to-face and non-face-to-face time with patient and daughter.  This included previsit chart review including review of recent hospitalization, lab review, study review, order entry, electronic health record documentation, patient education regarding recent stroke including etiology, secondary stroke prevention measures and aggressive stroke risk factor management and answered all questions to patient satisfaction   Frann Rider, AGNP-BC  [facility] [address] [place], [place] [address]  Phone [phone] Fax [phone] Note: This document was prepared with digital dictation and possible smart phrase technology. Any transcriptional errors that result from this process are unintentional.

## 2020-11-03 ENCOUNTER — Other Ambulatory Visit: Payer: Self-pay

## 2020-11-03 NOTE — Progress Notes (Signed)
I agree with the above plan 

## 2020-11-09 ENCOUNTER — Encounter: Payer: Self-pay | Admitting: *Deleted

## 2020-11-20 ENCOUNTER — Other Ambulatory Visit: Payer: Self-pay

## 2020-11-20 NOTE — Patient Outreach (Signed)
[facility]) Care Management  11/20/2020  Renato Pennick [birth year] [id number]    No Telephone outreach to patient, as mRS obtained successfully by Dr. Darden Dates. MRS= 0   Thank you, [place] Care Management Assistant

## 2021-05-17 ENCOUNTER — Ambulatory Visit (INDEPENDENT_AMBULATORY_CARE_PROVIDER_SITE_OTHER): Payer: Medicare Other | Admitting: Adult Health

## 2021-05-17 ENCOUNTER — Encounter: Payer: Self-pay | Admitting: Adult Health

## 2021-05-17 VITALS — BP 142/74 | HR 71 | Ht 65.0 in | Wt 137.0 lb

## 2021-05-17 DIAGNOSIS — I639 Cerebral infarction, unspecified: Secondary | ICD-10-CM

## 2021-05-17 NOTE — Progress Notes (Signed)
[facility] [address] [place]. Alejandro Moore [address] [phone]       STROKE FOLLOW UP NOTE  Alejandro Moore Date of Birth:  February 11, 1920 Medical Record Number:  [id number]   Reason for Referral: stroke follow up    SUBJECTIVE:   CHIEF COMPLAINT:  Chief Complaint  Patient presents with   Follow-up    RM 2 with daughter Alejandro Moore  Pt is well,     HPI:   Update 05/17/2021 JM: Returns for 68-month stroke follow-up accompanied by his daughter, Alejandro Moore.  Overall stable without new or reoccurring stroke/TIA symptoms.  Lives with daughter, maintains ADLs independently, does need assistance with IADLs.  Compliant on Eliquis and atorvastatin without side effects.  Blood pressure today 142/74.  Does not routinely monitor at home.  Routinely followed by cardiology. Has f/u visit with PCP next week.  He will be turning 88 years old next month! Daughter is working on a get together for his birthday which he is looking forward to! No new concerns at this time.    History provided for reference purposes only Initial visit 11/02/2020 JM: [name] is being seen for hospital follow-up accompanied by his daughter, Alejandro Moore.  He has been doing well since discharge without new or reoccurring stroke/TIA symptoms.  He has returned back to prior level of functioning.  Continues to live with his daughter who assists with IADLs but is able to maintain ADLs independently.  Remains on Eliquis -reports occasional nosebleeds lasting only short duration without intervention.  He does report chronic history of nosebleeds requiring cauterization which slightly worsened since starting Eliquis.  No other bleeding or bruising concerns.  Remains on atorvastatin 40 mg daily without myalgias or any other side effects.  Blood pressure today 124/73.  No concerns at this time.  Stroke admission 07/25/2020 [name] is a 88 yo male with a PMHx of AF (rate controlled, not on AC or platelet aggregators for  unclear reasons), CsHF, CKD III, HTN, CAD, macular degeneration, and GERD who presented via EMS on 07/25/2020 as a code stroke d/t acute onset of slurred speech and right arm numbness. This lasted for approximately 30 mins. Symptoms had resolved prior to arrival. BP 154/92 and CBG 109 in the field. Then he experienced recurrence of his symptoms for which tPA was given. Post tPA NIHSS=0. Enrolled in OPTIMIST main trial.  Personally reviewed hospitalization pertinent progress notes, lab work and imaging with summary provided.  Evaluated by Dr. Erlinda Moore with stroke work-up revealing small acute cortically based infarcts within the left frontal lobe motor strip and adjacent left parietal lobe postcentral gyrus as well as small chronic cortical infarcts in the right occipital lobe, etiology likely due to persistent A. fib not on AC (per note, pt previously refused due to bleeding side effect concerns).  Recommend initiating Eliquis 2.5 mg twice daily (age and weight) for secondary stroke prevention.  Known cardiomyopathy with EF 25% routinely followed by cardiology.  LDL 89 and start atorvastatin 40 mg daily (previously declined statins due to medication effects).  Other stroke risk factors include HTN, B12 deficiency placed on supplementation, advanced age and CHF.  Cleared by therapies and discharged home in stable condition.      ROS:   14 system review of systems performed and negative with exception of those listed in HPI  PMH:  Past Medical History:  Diagnosis Date   BPH (benign prostatic hyperplasia)    CVA (cerebral vascular accident) ([place])    Macular degeneration  PSH:  Past Surgical History:  Procedure Laterality Date   KNEE SURGERY     SHOULDER SURGERY      Social History:  Social History   Socioeconomic History   Marital status: Widowed    Spouse name: Not on file   Number of children: 2   Years of education: Not on file   Highest education level: Associate degree: occupational,  Hotel manager, or vocational program  Occupational History   Not on file  Tobacco Use   Smoking status: Never   Smokeless tobacco: Never  Vaping Use   Vaping Use: Never used  Substance and Sexual Activity   Alcohol use: Never   Drug use: Never   Sexual activity: Not on file  Other Topics Concern   Not on file  Social History Narrative   11/02/20 lives with daughter   Social Determinants of Health   Financial Resource Strain: Not on file  Food Insecurity: Not on file  Transportation Needs: Not on file  Physical Activity: Not on file  Stress: Not on file  Social Connections: Not on file  Intimate Partner Violence: Not on file    Family History: History reviewed. No pertinent family history.  Medications:   Current Outpatient Medications on File Prior to Visit  Medication Sig Dispense Refill   apixaban (ELIQUIS) 2.5 MG TABS tablet Take 1 tablet (2.5 mg total) by mouth 2 (two) times daily. 60 tablet 1   atorvastatin (LIPITOR) 40 MG tablet Take 1 tablet (40 mg total) by mouth daily. 30 tablet 11   esomeprazole (NEXIUM) 20 MG capsule Take 20 mg by mouth every other day.     fluticasone (FLONASE) 50 MCG/ACT nasal spray Place 2 sprays into both nostrils daily as needed for allergies or rhinitis.     furosemide (LASIX) 20 MG tablet TAKE 1 TABLET BY MOUTH EVERY DAY (Patient taking differently: Take 20 mg by mouth every morning.) 90 tablet 3   metoprolol tartrate (LOPRESSOR) 25 MG tablet Take 25 mg by mouth 2 (two) times daily.     Multiple Vitamins-Minerals (PRESERVISION AREDS 2) CAPS Take 1 capsule by mouth 2 (two) times daily.     OVER THE COUNTER MEDICATION Take 1 packet by mouth every morning. Multivitamin pak - [employer]     oxymetazoline (AFRIN) 0.05 % nasal spray Place 1 spray into both nostrils 2 (two) times daily as needed (nose bleeds).     potassium chloride (KLOR-CON) 10 MEQ tablet TAKE 1 TABLET BY MOUTH EVERY DAY (Patient taking differently: Take 10 mEq by  mouth every morning.) 90 tablet 3   tamsulosin (FLOMAX) 0.4 MG CAPS capsule Take 0.4 mg by mouth daily after supper.     vitamin B-12 1000 MCG tablet Take 1 tablet (1,000 mcg total) by mouth daily. 30 tablet 5   No current facility-administered medications on file prior to visit.    Allergies:  No Known Allergies    OBJECTIVE:  Physical Exam  Vitals:   05/17/21 1339  BP: (!) 142/74  Pulse: 71  Weight: 137 lb (62.1 kg)  Height: 5\' 5"  (1.651 m)   Body mass index is 22.8 kg/m. No results found.  General: well developed, well nourished, very pleasant elderly Caucasian male, seated, in no evident distress Head: head normocephalic and atraumatic.   Neck: supple with no carotid or supraclavicular bruits Cardiovascular: regular rate and rhythm, no murmurs Musculoskeletal: no deformity Skin:  no rash/petichiae Vascular:  Normal pulses all extremities   Neurologic Exam Mental Status:  Awake and fully alert.  Fluent speech and language.  Oriented to place and time. Recent and remote memory intact. Attention span, concentration and fund of knowledge appropriate. Mood and affect appropriate.  Cranial Nerves: Pupils equal, briskly reactive to light. Extraocular movements full without nystagmus. Visual fields full to confrontation. Hearing intact. Facial sensation intact. Face, tongue, palate moves normally and symmetrically.  Motor: Normal bulk and tone. Normal strength in all tested extremity muscles Sensory.: intact to touch , pinprick , position and vibratory sensation.  Coordination: Rapid alternating movements normal in all extremities. Finger-to-nose and heel-to-shin performed accurately bilaterally. Gait and Station: Arises from chair without difficulty. Stance is normal. Gait demonstrates normal stride length and balance without use of assistive device. Reflexes: 1+ and symmetric. Toes downgoing.         ASSESSMENT: Kristan Votta is a 88 y.o. year old male with recent  small acute cortically based infarcts in left frontal lobe and adjacent left parietal lobe postcentral gyrus infarcts on 07/25/2020 s/p tPA, etiology likely persistent A. fib not on AC. Vascular risk factors include AF not on AC (pt previously declined), chronic right occipital lobe infarct on imaging, HTN, HLD, cardiomyopathy, CHF and advanced age.      PLAN:  Embolic stroke:  Recovered well without residual deficit.   Continue Eliquis (apixaban) 2.5 mg twice daily and atorvastatin 40 mg daily for secondary stroke prevention.   Discussed secondary stroke prevention measures and importance of close PCP follow up for aggressive stroke risk factor management. I have gone over the pathophysiology of stroke, warning signs and symptoms, risk factors and their management in some detail with instructions to go to the closest emergency room for symptoms of concern. No medications are managed by this office A. fib: Continue Eliquis 2.5 mg twice daily (age and weight) for CHA2DS2-VASc score of at least 6 routinely followed by cardiology for monitoring and management HTN: BP goal <130/90.  Stable on current regimen per PCP HLD: LDL goal <70.  LDL 53 (11/2020) on atorvastatin 40 mg daily managed and monitored by PCP/cardiology    Overall stable, no further recommendations.  Follow-up as needed   CC:  PCP: Idelle Crouch, MD    I spent 24 minutes of face-to-face and non-face-to-face time with patient and daughter.  This included previsit chart review, lab review, study review, electronic health record documentation, patient education regarding prior stroke including etiology, secondary stroke prevention measures and aggressive stroke risk factor management and answered all question and daughters to patient satisfaction  Frann Rider, [facility]  [facility] [address] [place], [place] [address]  Phone [phone] Fax [phone] Note: This document was  prepared with digital dictation and possible smart phrase technology. Any transcriptional errors that result from this process are unintentional.

## 2021-05-17 NOTE — Patient Instructions (Signed)
Continue Eliquis (apixaban) daily  and atorvastatin  for secondary stroke prevention  Continue to follow up with PCP regarding cholesterol and blood pressure management  Maintain strict control of hypertension with blood pressure goal below 130/90 and cholesterol with LDL cholesterol (bad cholesterol) goal below 70 mg/dL.   Signs of a Stroke? Follow the BEFAST method:  Balance Watch for a sudden loss of balance, trouble with coordination or vertigo Eyes Is there a sudden loss of vision in one or both eyes? Or double vision?  Face: Ask the person to smile. Does one side of the face droop or is it numb?  Arms: Ask the person to raise both arms. Does one arm drift downward? Is there weakness or numbness of a leg? Speech: Ask the person to repeat a simple phrase. Does the speech sound slurred/strange? Is the person confused ? Time: If you observe any of these signs, call [phone].        Thank you for coming to see [place] at [facility]. I hope we have been able to provide you high quality care today.  You may receive a patient satisfaction survey over the next few weeks. We would appreciate your feedback and comments so that we may continue to improve ourselves and the health of our patients.

## 2021-07-05 ENCOUNTER — Other Ambulatory Visit: Payer: Self-pay

## 2021-07-05 ENCOUNTER — Encounter: Payer: Self-pay | Admitting: Dermatology

## 2021-07-05 ENCOUNTER — Ambulatory Visit (INDEPENDENT_AMBULATORY_CARE_PROVIDER_SITE_OTHER): Payer: Medicare Other | Admitting: Dermatology

## 2021-07-05 DIAGNOSIS — I639 Cerebral infarction, unspecified: Secondary | ICD-10-CM

## 2021-07-05 DIAGNOSIS — L578 Other skin changes due to chronic exposure to nonionizing radiation: Secondary | ICD-10-CM

## 2021-07-05 DIAGNOSIS — L821 Other seborrheic keratosis: Secondary | ICD-10-CM | POA: Diagnosis not present

## 2021-07-05 DIAGNOSIS — D691 Qualitative platelet defects: Secondary | ICD-10-CM

## 2021-07-05 DIAGNOSIS — L570 Actinic keratosis: Secondary | ICD-10-CM | POA: Diagnosis not present

## 2021-07-05 NOTE — Progress Notes (Signed)
? ?  Follow-Up Visit ?  ?Subjective  ?Alejandro Moore is a 88 y.o. male who presents for the following: Actinic Keratosis (Annual recheck of sun exposed areas for new or persistent precancerous lesions.). ?The patient has spots, moles and lesions to be evaluated, some may be new or changing and the patient has concerns that these could be cancer. ? ?The following portions of the chart were reviewed this encounter and updated as appropriate:  ? Tobacco  Allergies  Meds  Problems  Med Hx  Surg Hx  Fam Hx   ?  ?Review of Systems:  No other skin or systemic complaints except as noted in HPI or Assessment and Plan. ? ?Objective  ?Well appearing patient in no apparent distress; mood and affect are within normal limits. ? ?A focused examination was performed including the face, scalp, ears. Relevant physical exam findings are noted in the Assessment and Plan. ? ?Scalp x 1 ?Erythematous thin papules/macules with gritty scale.  ? ? ?Assessment & Plan  ?AK (actinic keratosis) ?Scalp x 1 ? ?Destruction of lesion - Scalp x 1 ?Complexity: simple   ?Destruction method: cryotherapy   ?Informed consent: discussed and consent obtained   ?Timeout:  patient name, date of birth, surgical site, and procedure verified ?Lesion destroyed using liquid nitrogen: Yes   ?Region frozen until ice ball extended beyond lesion: Yes   ?Outcome: patient tolerated procedure well with no complications   ?Post-procedure details: wound care instructions given   ? ?Actinic Damage ?- chronic, secondary to cumulative UV radiation exposure/sun exposure over time ?- diffuse scaly erythematous macules with underlying dyspigmentation ?- Recommend daily broad spectrum sunscreen SPF 30+ to sun-exposed areas, reapply every 2 hours as needed.  ?- Recommend staying in the shade or wearing long sleeves, sun glasses (UVA+UVB protection) and wide brim hats (4-inch brim around the entire circumference of the hat). ?- Call for new or changing  lesions. ? ?Seborrheic Keratoses ?- Stuck-on, waxy, tan-brown papules and/or plaques  ?- Benign-appearing ?- Discussed benign etiology and prognosis. ?- Observe ?- Call for any changes ? ?Purpura - Chronic; persistent and recurrent.  Treatable, but not curable. ?- Violaceous macules and patches ?- Benign ?- Related to trauma, age, sun damage and/or use of blood thinners, chronic use of topical and/or oral steroids ?- Observe ?- Can use OTC arnica containing moisturizer such as Dermend Bruise Formula if desired ?- Call for worsening or other concerns ? ?Return if symptoms worsen or fail to improve. ? ?I, Rudell Cobb, CMA, am acting as scribe for Sarina Ser, MD . ?Documentation: I have reviewed the above documentation for accuracy and completeness, and I agree with the above. ? ?Sarina Ser, MD ? ?

## 2021-07-05 NOTE — Patient Instructions (Signed)

## 2021-07-08 ENCOUNTER — Encounter: Payer: Self-pay | Admitting: Dermatology

## 2021-11-30 DIAGNOSIS — H269 Unspecified cataract: Secondary | ICD-10-CM

## 2021-11-30 HISTORY — DX: Unspecified cataract: H26.9

## 2021-12-20 ENCOUNTER — Encounter (HOSPITAL_COMMUNITY): Payer: Self-pay | Admitting: Emergency Medicine

## 2021-12-20 ENCOUNTER — Inpatient Hospital Stay (HOSPITAL_COMMUNITY)
Admission: EM | Admit: 2021-12-20 | Discharge: 2021-12-25 | DRG: 291 | Disposition: A | Discharge status: Skilled Nursing Facility | Payer: Medicare Other | Attending: Family Medicine | Admitting: Family Medicine

## 2021-12-20 ENCOUNTER — Emergency Department (HOSPITAL_COMMUNITY): Payer: Medicare Other

## 2021-12-20 ENCOUNTER — Other Ambulatory Visit: Payer: Self-pay

## 2021-12-20 DIAGNOSIS — E785 Hyperlipidemia, unspecified: Secondary | ICD-10-CM | POA: Diagnosis present

## 2021-12-20 DIAGNOSIS — E639 Nutritional deficiency, unspecified: Secondary | ICD-10-CM

## 2021-12-20 DIAGNOSIS — Y92009 Unspecified place in unspecified non-institutional (private) residence as the place of occurrence of the external cause: Secondary | ICD-10-CM

## 2021-12-20 DIAGNOSIS — E1122 Type 2 diabetes mellitus with diabetic chronic kidney disease: Secondary | ICD-10-CM | POA: Diagnosis present

## 2021-12-20 DIAGNOSIS — H3530 Unspecified macular degeneration: Secondary | ICD-10-CM | POA: Diagnosis present

## 2021-12-20 DIAGNOSIS — F0781 Postconcussional syndrome: Secondary | ICD-10-CM | POA: Diagnosis present

## 2021-12-20 DIAGNOSIS — S0001XA Abrasion of scalp, initial encounter: Secondary | ICD-10-CM | POA: Diagnosis not present

## 2021-12-20 DIAGNOSIS — I080 Rheumatic disorders of both mitral and aortic valves: Secondary | ICD-10-CM | POA: Diagnosis present

## 2021-12-20 DIAGNOSIS — E875 Hyperkalemia: Secondary | ICD-10-CM | POA: Diagnosis present

## 2021-12-20 DIAGNOSIS — I509 Heart failure, unspecified: Secondary | ICD-10-CM

## 2021-12-20 DIAGNOSIS — I130 Hypertensive heart and chronic kidney disease with heart failure and stage 1 through stage 4 chronic kidney disease, or unspecified chronic kidney disease: Secondary | ICD-10-CM | POA: Diagnosis not present

## 2021-12-20 DIAGNOSIS — E8809 Other disorders of plasma-protein metabolism, not elsewhere classified: Secondary | ICD-10-CM | POA: Diagnosis present

## 2021-12-20 DIAGNOSIS — I5043 Acute on chronic combined systolic (congestive) and diastolic (congestive) heart failure: Secondary | ICD-10-CM

## 2021-12-20 DIAGNOSIS — N401 Enlarged prostate with lower urinary tract symptoms: Secondary | ICD-10-CM | POA: Diagnosis present

## 2021-12-20 DIAGNOSIS — N138 Other obstructive and reflux uropathy: Secondary | ICD-10-CM | POA: Diagnosis present

## 2021-12-20 DIAGNOSIS — Z79899 Other long term (current) drug therapy: Secondary | ICD-10-CM

## 2021-12-20 DIAGNOSIS — I248 Other forms of acute ischemic heart disease: Secondary | ICD-10-CM | POA: Diagnosis present

## 2021-12-20 DIAGNOSIS — I2510 Atherosclerotic heart disease of native coronary artery without angina pectoris: Secondary | ICD-10-CM | POA: Diagnosis present

## 2021-12-20 DIAGNOSIS — Z7901 Long term (current) use of anticoagulants: Secondary | ICD-10-CM

## 2021-12-20 DIAGNOSIS — W19XXXA Unspecified fall, initial encounter: Secondary | ICD-10-CM

## 2021-12-20 DIAGNOSIS — I5023 Acute on chronic systolic (congestive) heart failure: Secondary | ICD-10-CM | POA: Diagnosis present

## 2021-12-20 DIAGNOSIS — Z681 Body mass index (BMI) 19 or less, adult: Secondary | ICD-10-CM

## 2021-12-20 DIAGNOSIS — R627 Adult failure to thrive: Secondary | ICD-10-CM | POA: Diagnosis present

## 2021-12-20 DIAGNOSIS — I480 Paroxysmal atrial fibrillation: Secondary | ICD-10-CM | POA: Diagnosis not present

## 2021-12-20 DIAGNOSIS — E870 Hyperosmolality and hypernatremia: Secondary | ICD-10-CM | POA: Diagnosis present

## 2021-12-20 DIAGNOSIS — J9601 Acute respiratory failure with hypoxia: Secondary | ICD-10-CM | POA: Diagnosis not present

## 2021-12-20 DIAGNOSIS — E440 Moderate protein-calorie malnutrition: Secondary | ICD-10-CM | POA: Diagnosis present

## 2021-12-20 DIAGNOSIS — W1830XA Fall on same level, unspecified, initial encounter: Secondary | ICD-10-CM | POA: Diagnosis present

## 2021-12-20 DIAGNOSIS — Z66 Do not resuscitate: Secondary | ICD-10-CM | POA: Diagnosis present

## 2021-12-20 DIAGNOSIS — N1831 Chronic kidney disease, stage 3a: Secondary | ICD-10-CM | POA: Diagnosis present

## 2021-12-20 DIAGNOSIS — Z8673 Personal history of transient ischemic attack (TIA), and cerebral infarction without residual deficits: Secondary | ICD-10-CM

## 2021-12-20 DIAGNOSIS — E869 Volume depletion, unspecified: Secondary | ICD-10-CM | POA: Diagnosis not present

## 2021-12-20 DIAGNOSIS — M1612 Unilateral primary osteoarthritis, left hip: Secondary | ICD-10-CM | POA: Diagnosis present

## 2021-12-20 DIAGNOSIS — Z20822 Contact with and (suspected) exposure to covid-19: Secondary | ICD-10-CM | POA: Diagnosis present

## 2021-12-20 DIAGNOSIS — R64 Cachexia: Secondary | ICD-10-CM | POA: Diagnosis present

## 2021-12-20 DIAGNOSIS — I700 Atherosclerosis of aorta: Secondary | ICD-10-CM | POA: Diagnosis present

## 2021-12-20 DIAGNOSIS — I4892 Unspecified atrial flutter: Secondary | ICD-10-CM | POA: Diagnosis present

## 2021-12-20 DIAGNOSIS — R54 Age-related physical debility: Secondary | ICD-10-CM | POA: Diagnosis present

## 2021-12-20 DIAGNOSIS — E871 Hypo-osmolality and hyponatremia: Secondary | ICD-10-CM | POA: Diagnosis present

## 2021-12-20 HISTORY — DX: Rheumatic tricuspid insufficiency: I07.1

## 2021-12-20 HISTORY — DX: Paroxysmal atrial fibrillation: I48.0

## 2021-12-20 HISTORY — DX: Atherosclerotic heart disease of native coronary artery without angina pectoris: I25.10

## 2021-12-20 HISTORY — DX: Chronic kidney disease, stage 3a: N18.31

## 2021-12-20 HISTORY — DX: Nonrheumatic aortic (valve) stenosis: I35.0

## 2021-12-20 HISTORY — DX: Nonrheumatic mitral (valve) insufficiency: I34.0

## 2021-12-20 HISTORY — DX: Atherosclerosis of aorta: I70.0

## 2021-12-20 HISTORY — DX: Chronic systolic (congestive) heart failure: I50.22

## 2021-12-20 LAB — CBC
HCT: 40.6 % (ref 39.0–52.0)
Hemoglobin: 13.2 g/dL (ref 13.0–17.0)
MCH: 36 pg — ABNORMAL HIGH (ref 26.0–34.0)
MCHC: 32.5 g/dL (ref 30.0–36.0)
MCV: 110.6 fL — ABNORMAL HIGH (ref 80.0–100.0)
Platelets: 152 10*3/uL (ref 150–400)
RBC: 3.67 MIL/uL — ABNORMAL LOW (ref 4.22–5.81)
RDW: 16.9 % — ABNORMAL HIGH (ref 11.5–15.5)
WBC: 5.3 10*3/uL (ref 4.0–10.5)
nRBC: 0 % (ref 0.0–0.2)

## 2021-12-20 LAB — I-STAT CHEM 8, ED
BUN: 56 mg/dL — ABNORMAL HIGH (ref 8–23)
Calcium, Ion: 1.11 mmol/L — ABNORMAL LOW (ref 1.15–1.40)
Chloride: 99 mmol/L (ref 98–111)
Creatinine, Ser: 1.2 mg/dL (ref 0.61–1.24)
Glucose, Bld: 114 mg/dL — ABNORMAL HIGH (ref 70–99)
HCT: 44 % (ref 39.0–52.0)
Hemoglobin: 15 g/dL (ref 13.0–17.0)
Potassium: 4.6 mmol/L (ref 3.5–5.1)
Sodium: 142 mmol/L (ref 135–145)
TCO2: 38 mmol/L — ABNORMAL HIGH (ref 22–32)

## 2021-12-20 LAB — COMPREHENSIVE METABOLIC PANEL
ALT: 35 U/L (ref 0–44)
AST: 33 U/L (ref 15–41)
Albumin: 2.4 g/dL — ABNORMAL LOW (ref 3.5–5.0)
Alkaline Phosphatase: 85 U/L (ref 38–126)
Anion gap: 7 (ref 5–15)
BUN: 38 mg/dL — ABNORMAL HIGH (ref 8–23)
CO2: 32 mmol/L (ref 22–32)
Calcium: 8.4 mg/dL — ABNORMAL LOW (ref 8.9–10.3)
Chloride: 103 mmol/L (ref 98–111)
Creatinine, Ser: 1.26 mg/dL — ABNORMAL HIGH (ref 0.61–1.24)
GFR, Estimated: 51 mL/min — ABNORMAL LOW (ref >60)
Glucose, Bld: 107 mg/dL — ABNORMAL HIGH (ref 70–99)
Potassium: 4.2 mmol/L (ref 3.5–5.1)
Sodium: 142 mmol/L (ref 135–145)
Total Bilirubin: 1 mg/dL (ref 0.3–1.2)
Total Protein: 5.2 g/dL — ABNORMAL LOW (ref 6.5–8.1)

## 2021-12-20 LAB — TROPONIN I (HIGH SENSITIVITY): Troponin I (High Sensitivity): 159 ng/L (ref <18)

## 2021-12-20 LAB — PROTIME-INR
INR: 1.6 — ABNORMAL HIGH (ref 0.8–1.2)
Prothrombin Time: 18.7 seconds — ABNORMAL HIGH (ref 11.4–15.2)

## 2021-12-20 LAB — RESP PANEL BY RT-PCR (FLU A&B, COVID) ARPGX2
Influenza A by PCR: NEGATIVE
Influenza B by PCR: NEGATIVE
SARS Coronavirus 2 by RT PCR: NEGATIVE

## 2021-12-20 LAB — ETHANOL: Alcohol, Ethyl (B): <10 mg/dL (ref <10)

## 2021-12-20 LAB — LACTIC ACID, PLASMA: Lactic Acid, Venous: 1.7 mmol/L (ref 0.5–1.9)

## 2021-12-20 MED ORDER — TAMSULOSIN HCL 0.4 MG PO CAPS
0.4000 mg | ORAL_CAPSULE | Freq: Every day | ORAL | Status: DC
  Administered 2021-12-20 – 2021-12-24 (×4): 0.4 mg via ORAL
  Filled 2021-12-20 (×4): qty 1

## 2021-12-20 MED ORDER — FUROSEMIDE 10 MG/ML IJ SOLN
20.0000 mg | Freq: Once | INTRAMUSCULAR | Status: AC
  Administered 2021-12-20: 20 mg via INTRAVENOUS
  Filled 2021-12-20: qty 2

## 2021-12-20 MED ORDER — METOPROLOL TARTRATE 25 MG PO TABS
25.0000 mg | ORAL_TABLET | Freq: Two times a day (BID) | ORAL | Status: DC
  Administered 2021-12-21: 25 mg via ORAL
  Filled 2021-12-20 (×2): qty 1

## 2021-12-20 MED ORDER — METOPROLOL TARTRATE 25 MG PO TABS: 25.0000 mg | ORAL_TABLET | Freq: Two times a day (BID) | ORAL | Status: DC

## 2021-12-20 MED ORDER — APIXABAN 2.5 MG PO TABS
2.5000 mg | ORAL_TABLET | Freq: Two times a day (BID) | ORAL | Status: DC
  Administered 2021-12-21 – 2021-12-24 (×7): 2.5 mg via ORAL
  Filled 2021-12-20 (×7): qty 1

## 2021-12-20 MED ORDER — ATORVASTATIN CALCIUM 40 MG PO TABS
40.0000 mg | ORAL_TABLET | Freq: Every day | ORAL | Status: DC
  Administered 2021-12-21 – 2021-12-24 (×4): 40 mg via ORAL
  Filled 2021-12-20 (×4): qty 1

## 2021-12-20 NOTE — Progress Notes (Signed)
CALL PAGER [phone] for any questions or notifications regarding this patient  FMTS Attending Note: Dorcas Mcmurray MD I have seen and examined the patient, reviewed chart.  Discussed with resident team.  Full H&P to follow. Patient had mechanical fall presented as a level 2 trauma; fall was from ground-level.  He has no memory of this.  He does have a gash on the top of his head that is no longer bleeding.  Regarding CODE STATUS: Discussion with him indicated he wanted full code but I am not sure of his current capacity.  He is only oriented to person.  Intermittently he knows he is in the hospital, at other moments he does not.  He tells me he lives alone but in reality he lives with his daughter and son-in-law.  Resident team to call daughter for additional input on CODE STATUS.  He appears mildly fluid overloaded in his lower extremities.  His lungs have very slight crackles at the bases.  He is emaciated.  Atrial fibrillation on chronic anticoagulation with Eliquis.  Admit for observation: We will ask cardiology to weigh in regarding his fluid status.  His last echo showed significantly reduced EF of 20 to 25% on July 26, 2020.  We will start very gentle diuresis.  He does not appear to have any focal neurological deficits.  His confusion is mild and could be related to concussion.  Will observe overnight at least.

## 2021-12-20 NOTE — Assessment & Plan Note (Addendum)
Acute on chronic systolic HF with pitting edema. Thought to be multifactorial with contributing protein calorie malnutrition. BNP consistent with HF, repeat echo with LVEF improved to 35-40% however this could also be falsely elevated in the setting of his intravascular volume depletion.. Continues to have pitting edema after one time dose of IV lasix and suspected to be intravascularly volume depleted. Troponin trended, elevated and stable but not a candidate for invasive work-up given his age. Received additional IV lasix 40 BID yesterday per cardiology given episode of desatting with attempt to wean O2. Plan to discharge on lower dose of metoprolol (12.5 BID) and change lasix to PO prior to discharge.   - Daily weights, strict I&O's - Cardiology consulted, appreciate their recs  - encourage PO intake, added ensure  - trending CMP  - Metoprolol 12.5 BID at time of discharge  - Lasix 40 BID at discharge - Supplemental O2 to maintain sats >85% and will likely need O2 on discharge. Continue to monitor O2 need.

## 2021-12-20 NOTE — Assessment & Plan Note (Addendum)
He had an unwitnessed fall at home with no LOC. He has bruising and bleeding on the top of his scalp. Traumatic w/u in the ED including CT head, DG L femur, DG pelvis, CT pelvis unremarkable. Continuing to progress with PT. No evidence of fx on MRI. Per family, he was tap dancing prior to admission. Suspect mental status change is due to post-concussive syndrome. Suspect he needs more physical therapy for help with ambulation due to his recent fall.  - Wound care for scalp  - Continue to monitor mental status  - SLP recommend dysphagia diet

## 2021-12-20 NOTE — ED Notes (Signed)
Trauma Response Nurse Documentation   Alejandro Moore is a 88 y.o. male arriving to [facility] ED via EMS  On Eliquis (apixaban) daily. Trauma was activated as a Level 2 by ED Charge RN based on the following trauma criteria Elderly patients > 65 with head trauma on anti-coagulation (excluding ASA). Trauma team at the bedside on patient arrival.   Patient cleared for CT by Dr. Armandina Gemma. Pt transported to [place] with ED primary nurse. RN remained with the patient throughout their absence from the department for clinical observation. Pt at risk for head bleed.   GCS 15.  History   Past Medical History:  Diagnosis Date   Actinic keratosis    BPH (benign prostatic hyperplasia)    CVA (cerebral vascular accident) ([place])    Macular degeneration      Past Surgical History:  Procedure Laterality Date   KNEE SURGERY     SHOULDER SURGERY       Initial Focused Assessment (If applicable, or please see trauma documentation): - GCS 15 - PERRLA - C/O no pain - Multiple skin tears/abrasions to top of pt's head. - +2 to +3 pitting edema bilateral feet and lower legs. - Pt's color slightly jaundice.  CT's Completed:   CT Head, CT C-Spine, CT Chest w/ contrast, and CT abdomen/pelvis w/ contrast   Interventions:  - 20G PIV to [facility] - Trauma labs - including a few other labs - CXR - Pelvic XR - CT pan scan  Plan for disposition:  Other Awaiting scans  Consults completed:  none at 1100.  Event Summary: - Pt BIB [facility] from home after sustaining a mechanical fall.  He struck his head on wood on the floor and possibly hit the top of his head on something else.  He is unsure of what exactly he hit his head on (possibly a cardboard box?).  No LOC.   Bedside handoff with ED RN [place].    Clovis Cao  Trauma Response RN  Please call TRN at [phone] for further assistance.

## 2021-12-20 NOTE — Progress Notes (Signed)
   12/20/21 1020  Clinical Encounter Type  Visited With Patient not available  Visit Type Initial;Trauma  Referral From Nurse  Consult/Referral To Chaplain   Chaplain responded to a level two trauma. Patient was under the care of the medical team.  No family was present. If a chaplain is requested someone will respond.   Danice Goltz [facility] [phone]

## 2021-12-20 NOTE — Progress Notes (Signed)
Orthopedic Tech Progress Note Patient Details:  Alejandro Moore 31-May-1919 [id number]  Level 2 trauma   Patient ID: Alejandro Moore, male   DOB: Oct 31, 1919, 88 y.o.   MRN: [id number]  Alejandro Moore 12/20/2021, 10:21 AM

## 2021-12-20 NOTE — Progress Notes (Signed)
Pt. Has critical troponin of 159. On call for FMTS paged to make aware.

## 2021-12-20 NOTE — ED Provider Notes (Signed)
[facility] EMERGENCY DEPARTMENT Provider Note   CSN: [id number] Arrival date & time: 12/20/21  1017     History  Chief Complaint  Patient presents with   Alejandro Moore    Alejandro Moore is a 88 y.o. male.   Fall     88 year old male with past medical history significant for CVA, atrial fibrillation on Eliquis, CHF, CKD, CAD, macular degeneration, BPH who presents to the emergency department as a level 2 trauma after a ground-level mechanical fall.  The patient states that he struck his head on wood on the floor possibly hit the top of his head on something but cannot remember.  He describes a mechanical fall.  He denies any loss of consciousness.  He is on Eliquis for A-fib.  He arrived to the emergency department GCS 15, ABC intact.  He is unsure of his last tetanus.  Currently, the patient's tetanus was updated in November 2021.  He sustained abrasions to the top of his head/scalp but otherwise denies any other traumatic injuries or complaints.  Home Medications Prior to Admission medications   Medication Sig Start Date End Date Taking? Authorizing Provider  apixaban (ELIQUIS) 2.5 MG TABS tablet Take 1 tablet (2.5 mg total) by mouth 2 (two) times daily. 07/27/20   Bailey-Modzik, Delila A, NP  atorvastatin (LIPITOR) 40 MG tablet Take 1 tablet (40 mg total) by mouth daily. 07/27/20 07/27/21  Bailey-Modzik, Delila A, NP  brimonidine (ALPHAGAN) 0.2 % ophthalmic solution Place 1 drop into the left eye 3 (three) times daily. 11/15/21   [provider]  esomeprazole (NEXIUM) 20 MG capsule Take 20 mg by mouth every other day.    [provider]  fluticasone (FLONASE) 50 MCG/ACT nasal spray Place 2 sprays into both nostrils daily as needed for allergies or rhinitis.    [provider]  furosemide (LASIX) 20 MG tablet TAKE 1 TABLET BY MOUTH EVERY DAY Patient taking differently: Take 20 mg by mouth every morning. 11/24/19   Alisa Graff, FNP   methocarbamol (ROBAXIN) 500 MG tablet Take 500 mg by mouth 2 (two) times daily as needed. 08/27/21   [provider]  metoprolol tartrate (LOPRESSOR) 25 MG tablet Take 25 mg by mouth 2 (two) times daily. 09/27/19   [provider]  Multiple Vitamins-Minerals (PRESERVISION AREDS 2) CAPS Take 1 capsule by mouth 2 (two) times daily.    [provider]  OVER THE COUNTER MEDICATION Take 1 packet by mouth every morning. Multivitamin pak - [employer] [facility] [employer]    [provider]  oxymetazoline (AFRIN) 0.05 % nasal spray Place 1 spray into both nostrils 2 (two) times daily as needed (nose bleeds).    [provider]  potassium chloride (KLOR-CON) 10 MEQ tablet TAKE 1 TABLET BY MOUTH EVERY DAY Patient taking differently: Take 10 mEq by mouth every morning. 11/24/19   Alisa Graff, FNP  tamsulosin (FLOMAX) 0.4 MG CAPS capsule Take 0.4 mg by mouth daily after supper. 04/26/19   [provider]  vitamin B-12 1000 MCG tablet Take 1 tablet (1,000 mcg total) by mouth daily. 07/27/20   Bailey-Modzik, Morene Crocker, NP      Allergies    Patient has no known allergies.    Review of Systems   Review of Systems  Skin:  Positive for wound.  All other systems reviewed and are negative.   Physical Exam Updated Vital Signs BP 132/67   Pulse 82   Temp 97.7 F (36.5 C)  Resp 20   Ht '5\' 5"'$  (1.651 m)   Wt 57.6 kg   SpO2 100%   BMI 21.13 kg/m  Physical Exam Vitals and nursing note reviewed.  Constitutional:      Appearance: He is well-developed.     Comments: GCS 15, ABC intact  HENT:     Head: Normocephalic.     Comments: Multiple skin tears/abrasions to the top of the scalp, hemostatic Eyes:     Conjunctiva/sclera: Conjunctivae normal.  Neck:     Comments: No midline tenderness to palpation of the cervical spine. ROM intact. Cardiovascular:     Rate and Rhythm: Normal rate and regular rhythm.  Pulmonary:     Effort: Pulmonary effort is  normal. No respiratory distress.     Breath sounds: Normal breath sounds.  Chest:     Comments: Chest wall stable and non-tender to AP and lateral compression. Clavicles stable and non-tender to AP compression Abdominal:     Palpations: Abdomen is soft.     Tenderness: There is no abdominal tenderness.     Comments: Pelvis stable to lateral compression.  Musculoskeletal:     Cervical back: Neck supple.     Right lower leg: Edema present.     Left lower leg: Edema present.     Comments: No midline tenderness to palpation of the thoracic or lumbar spine. Extremities atraumatic with intact ROM.   3+ Pitting edema noted to the level of the knees  Skin:    General: Skin is warm and dry.  Neurological:     Mental Status: He is alert.     Comments: CN II-XII grossly intact. Moving all four extremities spontaneously and sensation grossly intact.     ED Results / Procedures / Treatments   Labs (all labs ordered are listed, but only abnormal results are displayed) Labs Reviewed  CBC - Abnormal; Notable for the following components:      Result Value   RBC 3.67 (*)    MCV 110.6 (*)    MCH 36.0 (*)    RDW 16.9 (*)    All other components within normal limits  PROTIME-INR - Abnormal; Notable for the following components:   Prothrombin Time 18.7 (*)    INR 1.6 (*)    All other components within normal limits  COMPREHENSIVE METABOLIC PANEL - Abnormal; Notable for the following components:   Glucose, Bld 107 (*)    BUN 38 (*)    Creatinine, Ser 1.26 (*)    Calcium 8.4 (*)    Total Protein 5.2 (*)    Albumin 2.4 (*)    GFR, Estimated 51 (*)    All other components within normal limits  I-STAT CHEM 8, ED - Abnormal; Notable for the following components:   BUN 56 (*)    Glucose, Bld 114 (*)    Calcium, Ion 1.11 (*)    TCO2 38 (*)    All other components within normal limits  RESP PANEL BY RT-PCR (FLU A&B, COVID) ARPGX2  ETHANOL  LACTIC ACID, PLASMA  URINALYSIS, ROUTINE W REFLEX  MICROSCOPIC  BRAIN NATRIURETIC PEPTIDE  TROPONIN I (HIGH SENSITIVITY)    EKG EKG Interpretation  Date/Time:  Monday December 20 2021 10:26:11 EDT Ventricular Rate:  78 PR Interval:    QRS Duration: 115 QT Interval:  473 QTC Calculation: 539 R Axis:   -52 Text Interpretation: Atrial fibrillation LVH with IVCD, LAD and secondary repol abnrm Inferior infarct, old Prolonged QT interval Confirmed by Regan Lemming ([id number]) on  12/20/2021 1:21:05 PM  Radiology CT PELVIS WO CONTRAST  Result Date: 12/20/2021 CLINICAL DATA:  Hip trauma, fracture suspected. EXAM: CT PELVIS WITHOUT CONTRAST TECHNIQUE: Multidetector CT imaging of the pelvis was performed following the standard protocol without intravenous contrast. RADIATION DOSE REDUCTION: This exam was performed according to the departmental dose-optimization program which includes automated exposure control, adjustment of the mA and/or kV according to patient size and/or use of iterative reconstruction technique. COMPARISON:  Radiographs performed earlier on the same date. FINDINGS: Urinary Tract:  No abnormality visualized. Bowel: Advanced colonic diverticulosis without evidence of acute diverticulitis. Bowel loops are normal in caliber. Vascular/Lymphatic: Advanced atherosclerotic disease of abdominal aorta and branch vessels which are normal in caliber. Reproductive:  No mass or other significant abnormality Other:  None. Musculoskeletal: No displaced fracture or dislocation. Moderate bilateral hip osteoarthritis with joint space narrowing and marginal osteophytes. Advanced degenerate disc disease of the lower lumbar spine. Sacroiliac joints and pubic symphysis are intact. IMPRESSION: 1. No displaced fracture or dislocation. If patient is unable to bear weight and persistent hip pain, further evaluation with MR examination would be helpful to rule out occult fracture. 2.  Moderate bilateral hip osteoarthritis. 3.  Advanced degenerate disc disease of the  lumbar spine. 4. Severe sigmoid colonic diverticulosis without evidence of acute diverticulitis. 5.  Atherosclerotic disease of abdominal aorta and branch vessels. Electronically Signed   By: Keane Police D.O.   On: 12/20/2021 14:24   DG Femur Min 2 Views Left  Result Date: 12/20/2021 CLINICAL DATA:  Fall. EXAM: LEFT FEMUR 2 VIEWS COMPARISON:  None Available. FINDINGS: There is no evidence of fracture or other focal bone lesions. Mild degenerative changes of the left hip and knee with chondrocalcinosis. Osteopenia. Soft tissues are unremarkable. IMPRESSION: 1. No acute osseous abnormality. Electronically Signed   By: Titus Dubin M.D.   On: 12/20/2021 12:15   CT HEAD WO CONTRAST  Result Date: 12/20/2021 CLINICAL DATA:  Head trauma, moderate-severe; Polytrauma, blunt. EXAM: CT HEAD WITHOUT CONTRAST CT CERVICAL SPINE WITHOUT CONTRAST TECHNIQUE: Multidetector CT imaging of the head and cervical spine was performed following the standard protocol without intravenous contrast. Multiplanar CT image reconstructions of the cervical spine were also generated. RADIATION DOSE REDUCTION: This exam was performed according to the departmental dose-optimization program which includes automated exposure control, adjustment of the mA and/or kV according to patient size and/or use of iterative reconstruction technique. COMPARISON:  Head CT and head and neck CTA 07/25/2020. Head MRI 07/26/2020. Cervical spine CT 02/23/2020. FINDINGS: CT HEAD FINDINGS Brain: There is no evidence of an acute infarct, intracranial hemorrhage, mass, midline shift, or extra-axial fluid collection. Patchy to confluent hypodensities in the cerebral white matter bilaterally are unchanged and nonspecific but compatible with moderately extensive chronic small vessel ischemic disease. A small chronic cortical infarct is again noted in the right occipital lobe. There is mild-to-moderate cerebral and severe cerebellar atrophy. Vascular: Calcified  atherosclerosis at the skull base. No hyperdense vessel. Skull: No acute fracture or suspicious osseous lesion. Sinuses/Orbits: Mild mucosal thickening in the paranasal sinuses. No significant mastoid fluid. Left cataract extraction. Other: None. CT CERVICAL SPINE FINDINGS The study is mildly to moderately motion degraded, greatest at C1-2. Alignment: No evidence of acute traumatic malalignment. Skull base and vertebrae: No acute fracture or suspicious osseous lesion is identified within limitations of motion artifact. Moderate median C1-2 arthropathy. 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 Soft tissues and spinal canal: No prevertebral fluid or swelling. No visible canal hematoma. Disc levels: Moderately advanced disc degeneration at  C3-4, C4-5, and C6-7. Advanced facet arthrosis on the left at C2-3 and on the right at C4-5. Moderate bilateral facet arthrosis at C3-4. Mild multilevel neural foraminal stenosis. No evidence of high-grade spinal canal stenosis. Upper chest: Right greater than left lung apical scarring. Small bilateral pleural effusions. Other: Mild calcific atherosclerosis at the carotid bifurcations. IMPRESSION: 1. No evidence of acute intracranial abnormality. 2. Moderately extensive chronic small vessel ischemic disease. Severe cerebellar atrophy. 3. No acute cervical spine fracture identified within limitations of motion artifact. 4. Small bilateral pleural effusions. Electronically Signed   By: Logan Bores M.D.   On: 12/20/2021 11:06   CT CERVICAL SPINE WO CONTRAST  Result Date: 12/20/2021 CLINICAL DATA:  Head trauma, moderate-severe; Polytrauma, blunt. EXAM: CT HEAD WITHOUT CONTRAST CT CERVICAL SPINE WITHOUT CONTRAST TECHNIQUE: Multidetector CT imaging of the head and cervical spine was performed following the standard protocol without intravenous contrast. Multiplanar CT image reconstructions of the cervical spine were also generated. RADIATION DOSE REDUCTION: This exam was performed according to the  departmental dose-optimization program which includes automated exposure control, adjustment of the mA and/or kV according to patient size and/or use of iterative reconstruction technique. COMPARISON:  Head CT and head and neck CTA 07/25/2020. Head MRI 07/26/2020. Cervical spine CT 02/23/2020. FINDINGS: CT HEAD FINDINGS Brain: There is no evidence of an acute infarct, intracranial hemorrhage, mass, midline shift, or extra-axial fluid collection. Patchy to confluent hypodensities in the cerebral white matter bilaterally are unchanged and nonspecific but compatible with moderately extensive chronic small vessel ischemic disease. A small chronic cortical infarct is again noted in the right occipital lobe. There is mild-to-moderate cerebral and severe cerebellar atrophy. Vascular: Calcified atherosclerosis at the skull base. No hyperdense vessel. Skull: No acute fracture or suspicious osseous lesion. Sinuses/Orbits: Mild mucosal thickening in the paranasal sinuses. No significant mastoid fluid. Left cataract extraction. Other: None. CT CERVICAL SPINE FINDINGS The study is mildly to moderately motion degraded, greatest at C1-2. Alignment: No evidence of acute traumatic malalignment. Skull base and vertebrae: No acute fracture or suspicious osseous lesion is identified within limitations of motion artifact. Moderate median C1-2 arthropathy. 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 Soft tissues and spinal canal: No prevertebral fluid or swelling. No visible canal hematoma. Disc levels: Moderately advanced disc degeneration at C3-4, C4-5, and C6-7. Advanced facet arthrosis on the left at C2-3 and on the right at C4-5. Moderate bilateral facet arthrosis at C3-4. Mild multilevel neural foraminal stenosis. No evidence of high-grade spinal canal stenosis. Upper chest: Right greater than left lung apical scarring. Small bilateral pleural effusions. Other: Mild calcific atherosclerosis at the carotid bifurcations. IMPRESSION: 1. No evidence of  acute intracranial abnormality. 2. Moderately extensive chronic small vessel ischemic disease. Severe cerebellar atrophy. 3. No acute cervical spine fracture identified within limitations of motion artifact. 4. Small bilateral pleural effusions. Electronically Signed   By: Logan Bores M.D.   On: 12/20/2021 11:06   DG Pelvis Portable  Result Date: 12/20/2021 CLINICAL DATA:  Trauma EXAM: PORTABLE PELVIS 1-2 VIEWS COMPARISON:  CT 09/24/2019 FINDINGS: Suboptimally positioned pelvic radiograph to evaluate the femoral necks. There is a possible subcapital fracture of the left femoral neck. There is moderate left and mild right osteoarthritis of the hips. Degenerative changes of the lumbar spine and SI joints. IMPRESSION: Possible subcapital fracture of the left femoral neck. Recommend dedicated hip radiographs or CT of the pelvis. Electronically Signed   By: Maurine Simmering M.D.   On: 12/20/2021 10:52   DG Chest [facility] 1 View  Result  Date: 12/20/2021 CLINICAL DATA:  Trauma EXAM: PORTABLE CHEST 1 VIEW COMPARISON:  None Available. FINDINGS: Mildly enlarged cardiac silhouette. Aortic arch calcifications. There are diffuse interstitial and lower lung predominant alveolar opacities. There are small bilateral pleural effusions. No pneumothorax. There is a double density appearance of the left midclavicle consistent with chronic midclavicle fracture deformity is seen on prior CT of the cervical spine in November 2021. Bilateral shoulder degenerative changes. Thoracic spondylosis. IMPRESSION: Mild to moderate pulmonary edema with small bilateral pleural effusions and adjacent basilar atelectasis. Mild cardiomegaly. Chronic left mid clavicle injury. Electronically Signed   By: Maurine Simmering M.D.   On: 12/20/2021 10:46    Procedures Procedures    Medications Ordered in ED Medications  furosemide (LASIX) injection 20 mg (20 mg Intravenous Given 12/20/21 1402)    ED Course/ Medical Decision Making/ A&P                            Medical Decision Making Amount and/or Complexity of Data Reviewed Labs: ordered. Radiology: ordered.  Risk Prescription drug management. Decision regarding hospitalization.     88 year old male with past medical history significant for CVA, atrial fibrillation on Eliquis, CHF, CKD, CAD, macular degeneration, BPH who presents to the emergency department as a level 2 trauma after a ground-level mechanical fall.  The patient states that he struck his head on wood on the floor possibly hit the top of his head on something but cannot remember.  He describes a mechanical fall.  He denies any loss of consciousness.  He is on Eliquis for A-fib.  He arrived to the emergency department GCS 15, ABC intact.  He is unsure of his last tetanus.  Currently, the patient's tetanus was updated in November 2021.  He sustained abrasions to the top of his head/scalp but otherwise denies any other traumatic injuries or complaints.   Currently, he is awake, alert, and protecting his own airway and is hemodynamically stable. Vitals stable and rate controlled atrial fibrillation noted on EKG and cardiac telemetry.  Trauma imaging revealed (full reports in EMR): Portable CXR:  No evidence of pneumothorax or tracheal deviation Portable Pelvis:  IMPRESSION:  Possible subcapital fracture of the left femoral neck. Recommend  dedicated hip radiographs or CT of the pelvis.    CT Head & CT Cervical Spine: IMPRESSION:  1. No evidence of acute intracranial abnormality.  2. Moderately extensive chronic small vessel ischemic disease.  Severe cerebellar atrophy.  3. No acute cervical spine fracture identified within limitations of  motion artifact.  4. Small bilateral pleural effusions.   CXR: IMPRESSION:  Mild to moderate pulmonary edema with small bilateral pleural  effusions and adjacent basilar atelectasis. Mild cardiomegaly.    Chronic left mid clavicle injury.   The patient is without chest pain or  shortness of breath. Mild pulmonary edema noted on CXR. Not tachypneic or hypoxic. Has 3+ bilateral LE pitting edema concerning which in conjunction with pulmonary edema is concerning for a CHF exacerbation. Patient on '20mg'$  Lasix daily. Lasix '20mg'$  IV ordered.  There were no significant lab abnormalities, however troponin and BNP were pending.  The patient received '40mg'$  IV Lasix while in the ED.  The patient will be admitted to the medicine service for treatment of a likely CHF exacerbation.   Final Clinical Impression(s) / ED Diagnoses Final diagnoses:  Fall, initial encounter  Acute on chronic congestive heart failure, unspecified heart failure type ([place])    Rx /  DC Orders ED Discharge Orders     None         Regan Lemming, MD 12/20/21 1515

## 2021-12-20 NOTE — Plan of Care (Signed)
  Problem: Safety: Goal: Ability to remain free from injury will improve Outcome: Progressing   Problem: Activity: Goal: Capacity to carry out activities will improve Outcome: Progressing   

## 2021-12-20 NOTE — Hospital Course (Addendum)
Alejandro Moore is a 88 y.o. male presenting after a mechanical fall and with acute on chronic HF.  PMHx: CHF (NYHA Class 3), pA fib, CKD stage 3, HLD   CHF (congestive heart failure), NYHA class III, acute on chronic, systolic (HCC) Moderate to severe MR  Acute on chronic systolic HF with pitting edema. Cardiology was consulted who felt his fluid overload was likely multifactorial and contributed to by low albumin with possible protein-calorie malnutrition. Per family, he has been compliant with his medications and has not had progressive dyspnea or any reported chest pain. Repeat echo with slightly improved EF but this was thought to be falsely elevated due to his depleted intravascular volume. He received one time dose of IV lasix in the ED with subsequent hyperkalemia and hyponatremia so we continued with gentle diuresis during admission. He did not tolerate weaning O2 initially, had an episode of hypoxia with stable CXR. Additional IV lasix was administered. He was transitioned from IV to PO lasix. Cardiology also recommended continuing with fluid supplements to increase albumin. He was able to ambulate with PT without O2 at time of discharge.  Elevated troponins  He had elevated but stable troponins during admission without any angina, likely related to demand ischemia. Cardiology did not recommend invasive work-up given his age.   Fall at home Altered mental status  He had an unwitnessed fall at home with no LOC. He has bruising and bleeding on the top of his scalp. Traumatic w/u in the ED including CT head, DG L femur, DG pelvis, CT pelvis unremarkable. On admission, he is only oriented to self. Suspect his altered mental status in the setting of post-concussive syndrome. Patient unable to bear weight with PT and at baseline was tap dancing at home. MRI of L hip was obtained and did not show evidence of occult fracture and patient was able to ambulate with PT prior to discharge.   Afib He  was restarted on his home eliquis and this was continued per cardiology. He was also restarted on his home metoprolol but this was held in the setting of bradycardia. At discharge, cardiology transitioned him to metoprolol succinate.   Poor nutrition He had poor PO intake during admission and ensure was added to supplement his diet.     Chronic conditions:  HLD: restarted home lipitor 40  BPH: restarted home tamsulosin 0.4 Afib: restarted home eliquis   Items for Follow Up: Macrocytosis inpatient, check folate/B12 outpatient. Recommend starting multivitamin Monitor volume status as on twice daily lasix Check BMP to monitor creatinine in 3 days

## 2021-12-20 NOTE — Assessment & Plan Note (Addendum)
EKG with Afib on admission. Rate controlled at home with Metoprolol  - Continue home Eliquis 2.'5mg'$  BID - Decreased Metoprolol tartrate 12.'5mg'$  BID in the setting of bradycardia episode - Continuous cardiac monitoring

## 2021-12-20 NOTE — H&P (Cosign Needed Addendum)
Hospital Admission History and Physical Service Pager: [phone]  Patient name: Alejandro Moore Medical record number: [id number] Date of Birth: 08/01/1919 Age: 88 y.o. Gender: male  Primary Care Provider: Idelle Crouch, MD Consultants: none Code Status: DNR/DNI which was confirmed with family   Preferred Emergency Contact: [place] [phone] (she is also POA)  Chief Complaint: s/p fall and acute on chronic HF   Assessment and Plan: [name] is a 88 y.o. male presenting after a mechanical fall and with acute on chronic HF.  PMHx: CHF (NYHA Class 3), pA fib, CKD stage 3, HLD  * CHF (congestive heart failure), NYHA class III, acute on chronic, systolic (HCC) Acute on chronic systolic HF with pitting edema. Unknown trigger of his acute HF, has had leg swelling recently.  Most recent echo in 07/2020 with LVEF of 20-25%. He received one time dose of lasix in the ED.  - Admit to cardiac tele, attending Dr. Nori Riis  - Vitals per floor  - AM labs  - Daily weights, strict I&O's - PT/OT consult - Wean O2 as able - Cardiology consult in the AM  - Continue home metoprolol  - Echocardiogram - Continuous cardiac monitoring - Continuous O2 monitoring - Supplemental O2 to maintain sats >90% - Follow-up troponin and BNP    Fall at home He had an unwitnessed fall at home with no LOC. He has bruising and bleeding on the top of his scalp. Traumatic w/u in the ED including CT head, DG L femur, DG pelvis, CT pelvis unremarkable. On interview today, he is only oriented to self. Possible post-concussive syndrome vs. baseline mental status.  - Wound care for scalp  - Continue to monitor mental status  - SLP consult for diet recs  Paroxysmal A-fib (HCC) EKG with Afib on admission. Rate controlled at home with Metoprolol  - Continue home Eliquis 2.'5mg'$  BID - Continue home Metoprolol tartrate '25mg'$  BID - Continuous cardiac monitoring   Chronic conditions:  HLD: restarted home  lipitor 40  BPH: restarted home tamsulosin 0.4  FEN/GI: NPO, pending SLP eval  VTE Prophylaxis: on eliquis   Disposition: Pending   History of Present Illness:  Alejandro Moore is a 88 y.o. male presenting with a mechanical fall and fluid overload in the setting of CHF. He cannot recall what led to this hospitalization. He denies any chest or abdominal pain. He reports that he lives alone.   Per daughter, at baseline he does not drive or cook for himself. He lives with daughter and son-in-law. She reports he had an unwitnessed fall where he hit the footboard of the bed. EMS physician reported he is an active 88yo that plays golf. She reports he has been complaining about his legs and have been swelling on and off since the weekend. Per chart review, he had an appointment 9/11. She reports he has not been reporting chest pain.   In the ED, patient was brought in by EMS after mechanical fall. He denied loss of consciousness and struck his head on wood. Negative traumatic w/u imaging and no hip fracture. CXR with pulmonary edema and patient requiring O2. He received lasix '20mg'$  IV. CBC wnl. Ethanol, LA unremarkable. PT/INR elevated. Cr 1.26. Troponin pending. BNP pending.   Review Of Systems: Limited due to mental status. Denies chest pain, shortness of breath, abdominal pain/swelling  Pertinent Past Medical History: CHF (NYHA Class 3) pA fib CKD stage 3 HLD  Remainder reviewed in history tab.   Pertinent Past Surgical  History: None  Remainder reviewed in history tab.   Pertinent Social History: Tobacco use: No Alcohol use: No Other Substance use: None Lives with daughter and son-in-law  Pertinent Family History: None  Remainder reviewed in history tab.   Important Outpatient Medications: Eliquis 2.'5mg'$  BID  Lipitor '40mg'$  daily  Lasix 20 daily  Lopressor 25 BID  Tamsulosin 0.'4mg'$   Remainder reviewed in medication history.   Objective: BP 127/69   Pulse 81   Temp 97.7  F (36.5 C)   Resp 15   Ht '5\' 5"'$  (1.651 m)   Wt 57.6 kg   SpO2 100%   BMI 21.13 kg/m  Exam: General: Closing eyes throughout interview. Alert, frail appearing Caucasian male on O2 and in no acute distress. Eyes: PERRLA, EOMI Cardiovascular: Normal rate. Irregular rhythm. 3/6 murmur appreciated Respiratory: CTAB, comfortably on 4L via [place] Gastrointestinal: Soft, non-tender, non-distended  Ext: 2-3+ pitting edema in BLE, palpable pulses bilaterally.  Neuro: Oriented to person (thought it was 2000 and couldn't name the city), knew he was in a hospital.  Labs:  CBC BMET  Recent Labs  Lab 12/20/21 1027 12/20/21 1035  WBC 5.3  --   HGB 13.2 15.0  HCT 40.6 44.0  PLT 152  --    Recent Labs  Lab 12/20/21 1240  NA 142  K 4.2  CL 103  CO2 32  BUN 38*  CREATININE 1.26*  GLUCOSE 107*  CALCIUM 8.4*    Pertinent additional labs troponin and BNP pending.   EKG: My own interpretation (not copied from electronic read) Atrial fibrillation     Imaging Studies Performed:  Imaging Study (ie. Chest x-ray) Impression from Radiologist:   CXR: Mild to moderate pulmonary edema with small bilateral pleural effusions and adjacent basilar atelectasis. Mild cardiomegaly. Chronic left mid clavicle injury. DG pelvis: Possible subcapital fracture of the left femoral neck. Recommend dedicated hip radiographs or CT of the pelvis. CT Pelvis: No displaced fracture or dislocation. Moderate bilateral hip osteoarthritis.  Advanced degenerate disc disease of the lumbar spine. Severe sigmoid colonic diverticulosis without evidence of acute diverticulitis. Atherosclerotic disease of abdominal aorta and branch vessels DG Femur: No acute osseous abnormality. CT head: No evidence of acute intracranial abnormality. Moderately extensive chronic small vessel ischemic disease. Severe cerebellar atrophy. CT cervical spine:  No acute cervical spine fracture   Echo 07/2020: LVEF 20-25%    Rolanda Lundborg,  MD 12/20/2021, 4:36 PM PGY-1, [place] Intern pager: [phone], text pages welcome Secure chat group [place] Upper-Level Resident Addendum   I have independently interviewed and examined the patient. I have discussed the above with the original author and agree with their documentation. My edits for correction/addition/clarification are in within the document. Please see also any attending notes.   Rise Patience, DO  PGY-3, [facility] 12/20/2021 4:46 PM  FPTS Service pager: [phone] (text pages welcome through [facility])

## 2021-12-20 NOTE — ED Triage Notes (Signed)
Pt BIB [facility] from home due to generalized weakness.  Pt became weak/dizzy and lost consciousness.

## 2021-12-21 ENCOUNTER — Encounter (HOSPITAL_COMMUNITY): Payer: Self-pay | Admitting: Psychiatry

## 2021-12-21 ENCOUNTER — Observation Stay (HOSPITAL_COMMUNITY): Payer: Medicare Other

## 2021-12-21 DIAGNOSIS — Y92009 Unspecified place in unspecified non-institutional (private) residence as the place of occurrence of the external cause: Secondary | ICD-10-CM | POA: Diagnosis not present

## 2021-12-21 DIAGNOSIS — N1831 Chronic kidney disease, stage 3a: Secondary | ICD-10-CM | POA: Diagnosis present

## 2021-12-21 DIAGNOSIS — E875 Hyperkalemia: Secondary | ICD-10-CM | POA: Diagnosis present

## 2021-12-21 DIAGNOSIS — W19XXXD Unspecified fall, subsequent encounter: Secondary | ICD-10-CM | POA: Diagnosis not present

## 2021-12-21 DIAGNOSIS — I700 Atherosclerosis of aorta: Secondary | ICD-10-CM | POA: Diagnosis present

## 2021-12-21 DIAGNOSIS — I509 Heart failure, unspecified: Secondary | ICD-10-CM

## 2021-12-21 DIAGNOSIS — Z20822 Contact with and (suspected) exposure to covid-19: Secondary | ICD-10-CM | POA: Diagnosis present

## 2021-12-21 DIAGNOSIS — S0001XA Abrasion of scalp, initial encounter: Secondary | ICD-10-CM | POA: Diagnosis present

## 2021-12-21 DIAGNOSIS — E869 Volume depletion, unspecified: Secondary | ICD-10-CM | POA: Diagnosis not present

## 2021-12-21 DIAGNOSIS — I5043 Acute on chronic combined systolic (congestive) and diastolic (congestive) heart failure: Secondary | ICD-10-CM | POA: Diagnosis present

## 2021-12-21 DIAGNOSIS — R64 Cachexia: Secondary | ICD-10-CM | POA: Diagnosis present

## 2021-12-21 DIAGNOSIS — J9601 Acute respiratory failure with hypoxia: Secondary | ICD-10-CM | POA: Diagnosis not present

## 2021-12-21 DIAGNOSIS — I130 Hypertensive heart and chronic kidney disease with heart failure and stage 1 through stage 4 chronic kidney disease, or unspecified chronic kidney disease: Secondary | ICD-10-CM | POA: Diagnosis present

## 2021-12-21 DIAGNOSIS — I4892 Unspecified atrial flutter: Secondary | ICD-10-CM | POA: Diagnosis present

## 2021-12-21 DIAGNOSIS — I480 Paroxysmal atrial fibrillation: Secondary | ICD-10-CM | POA: Diagnosis present

## 2021-12-21 DIAGNOSIS — N138 Other obstructive and reflux uropathy: Secondary | ICD-10-CM | POA: Diagnosis present

## 2021-12-21 DIAGNOSIS — E1122 Type 2 diabetes mellitus with diabetic chronic kidney disease: Secondary | ICD-10-CM | POA: Diagnosis present

## 2021-12-21 DIAGNOSIS — E8809 Other disorders of plasma-protein metabolism, not elsewhere classified: Secondary | ICD-10-CM | POA: Diagnosis present

## 2021-12-21 DIAGNOSIS — Z66 Do not resuscitate: Secondary | ICD-10-CM | POA: Diagnosis present

## 2021-12-21 DIAGNOSIS — E440 Moderate protein-calorie malnutrition: Secondary | ICD-10-CM | POA: Diagnosis present

## 2021-12-21 DIAGNOSIS — I248 Other forms of acute ischemic heart disease: Secondary | ICD-10-CM | POA: Diagnosis present

## 2021-12-21 DIAGNOSIS — R627 Adult failure to thrive: Secondary | ICD-10-CM | POA: Diagnosis present

## 2021-12-21 DIAGNOSIS — I080 Rheumatic disorders of both mitral and aortic valves: Secondary | ICD-10-CM | POA: Diagnosis present

## 2021-12-21 DIAGNOSIS — W1830XA Fall on same level, unspecified, initial encounter: Secondary | ICD-10-CM | POA: Diagnosis present

## 2021-12-21 DIAGNOSIS — E871 Hypo-osmolality and hyponatremia: Secondary | ICD-10-CM | POA: Diagnosis present

## 2021-12-21 DIAGNOSIS — Z681 Body mass index (BMI) 19 or less, adult: Secondary | ICD-10-CM | POA: Diagnosis not present

## 2021-12-21 DIAGNOSIS — E639 Nutritional deficiency, unspecified: Secondary | ICD-10-CM | POA: Diagnosis not present

## 2021-12-21 DIAGNOSIS — E785 Hyperlipidemia, unspecified: Secondary | ICD-10-CM | POA: Diagnosis present

## 2021-12-21 DIAGNOSIS — N401 Enlarged prostate with lower urinary tract symptoms: Secondary | ICD-10-CM | POA: Diagnosis present

## 2021-12-21 DIAGNOSIS — E870 Hyperosmolality and hypernatremia: Secondary | ICD-10-CM | POA: Diagnosis present

## 2021-12-21 DIAGNOSIS — W19XXXA Unspecified fall, initial encounter: Secondary | ICD-10-CM | POA: Diagnosis not present

## 2021-12-21 LAB — ECHOCARDIOGRAM COMPLETE
AR max vel: 1.61 cm2
AV Area VTI: 1.65 cm2
AV Area mean vel: 1.46 cm2
AV Mean grad: 17.3 mmHg
AV Peak grad: 35 mmHg
Ao pk vel: 2.96 m/s
Area-P 1/2: 6.54 cm2
Height: 65 in
S' Lateral: 4.1 cm
Single Plane A4C EF: 38.1 %
Weight: 1975.32 oz

## 2021-12-21 LAB — BASIC METABOLIC PANEL
Anion gap: 14 (ref 5–15)
Anion gap: 11 (ref 5–15)
BUN: 35 mg/dL — ABNORMAL HIGH (ref 8–23)
BUN: 36 mg/dL — ABNORMAL HIGH (ref 8–23)
CO2: 30 mmol/L (ref 22–32)
CO2: 32 mmol/L (ref 22–32)
Calcium: 8.8 mg/dL — ABNORMAL LOW (ref 8.9–10.3)
Calcium: 8.4 mg/dL — ABNORMAL LOW (ref 8.9–10.3)
Chloride: 104 mmol/L (ref 98–111)
Chloride: 101 mmol/L (ref 98–111)
Creatinine, Ser: 1.3 mg/dL — ABNORMAL HIGH (ref 0.61–1.24)
Creatinine, Ser: 1.35 mg/dL — ABNORMAL HIGH (ref 0.61–1.24)
GFR, Estimated: 49 mL/min — ABNORMAL LOW (ref >60)
GFR, Estimated: 47 mL/min — ABNORMAL LOW (ref >60)
Glucose, Bld: 96 mg/dL (ref 70–99)
Glucose, Bld: 111 mg/dL — ABNORMAL HIGH (ref 70–99)
Potassium: 5.5 mmol/L — ABNORMAL HIGH (ref 3.5–5.1)
Potassium: 4.4 mmol/L (ref 3.5–5.1)
Sodium: 148 mmol/L — ABNORMAL HIGH (ref 135–145)
Sodium: 144 mmol/L (ref 135–145)

## 2021-12-21 LAB — BRAIN NATRIURETIC PEPTIDE: B Natriuretic Peptide: 2065.1 pg/mL — ABNORMAL HIGH (ref 0.0–100.0)

## 2021-12-21 LAB — CBC
HCT: 36.1 % — ABNORMAL LOW (ref 39.0–52.0)
Hemoglobin: 11.6 g/dL — ABNORMAL LOW (ref 13.0–17.0)
MCH: 36.1 pg — ABNORMAL HIGH (ref 26.0–34.0)
MCHC: 32.1 g/dL (ref 30.0–36.0)
MCV: 112.5 fL — ABNORMAL HIGH (ref 80.0–100.0)
Platelets: 103 10*3/uL — ABNORMAL LOW (ref 150–400)
RBC: 3.21 MIL/uL — ABNORMAL LOW (ref 4.22–5.81)
RDW: 16.9 % — ABNORMAL HIGH (ref 11.5–15.5)
WBC: 6.2 10*3/uL (ref 4.0–10.5)
nRBC: 0 % (ref 0.0–0.2)

## 2021-12-21 LAB — TROPONIN I (HIGH SENSITIVITY)
Troponin I (High Sensitivity): 176 ng/L (ref <18)
Troponin I (High Sensitivity): 192 ng/L (ref <18)
Troponin I (High Sensitivity): 208 ng/L (ref <18)
Troponin I (High Sensitivity): 205 ng/L (ref <18)

## 2021-12-21 MED ORDER — SODIUM ZIRCONIUM CYCLOSILICATE 10 G PO PACK
10.0000 g | PACK | Freq: Once | ORAL | Status: AC
  Administered 2021-12-21: 10 g via ORAL
  Filled 2021-12-21: qty 1

## 2021-12-21 MED ORDER — ENSURE ENLIVE PO LIQD
237.0000 mL | Freq: Two times a day (BID) | ORAL | Status: DC
  Administered 2021-12-22 – 2021-12-23 (×2): 237 mL via ORAL

## 2021-12-21 MED ORDER — FUROSEMIDE 10 MG/ML IJ SOLN: 40.0000 mg | Freq: Once | INTRAMUSCULAR | Status: DC

## 2021-12-21 NOTE — NC FL2 (Signed)
  [place] LEVEL OF CARE SCREENING TOOL     IDENTIFICATION  Patient Name: Alejandro Moore Birthdate: Mar 28, 1920 Sex: male Admission Date (Current Location): 12/20/2021  [facility] and [place] Number:  Herbalist and Address:  [facility] [place], [place] [address], [place], [place] [address]      Provider Number: [id number]  Attending Physician Name and Address:  Dickie La, MD  Relative Name and Phone Number:       Current Level of Care: Hospital Recommended Level of Care: [place] Prior Approval Number:    Date Approved/Denied:   PASRR Number: [id number]  Discharge Plan: SNF    Current Diagnoses: Patient Active Problem List   Diagnosis Date Noted   Hyperkalemia 12/21/2021   CHF (congestive heart failure) ([place]) 12/21/2021   Acute on chronic combined systolic and diastolic CHF (congestive heart failure) ([place]) 12/20/2021   Fall at home 12/20/2021   Stroke (cerebrum) ([place]) 07/25/2020   Paroxysmal A-fib ([place]) 02/17/2020   Atherosclerosis of abdominal aorta ([place]) 09/27/2019   Coronary artery disease involving native coronary artery of native heart 09/27/2019   Pleural effusion 37/16/9678   Acute systolic CHF (congestive heart failure) ([place]) 09/06/2019   CKD (chronic kidney disease) stage 3, GFR 30-59 ml/min ([place]) 09/06/2019   BPH with urinary obstruction 04/26/2019   Environmental allergies 04/26/2019   Gastroesophageal reflux disease without esophagitis 04/26/2019   Nonexudative age-related macular degeneration 04/26/2019    Orientation RESPIRATION BLADDER Height & Weight     Self, Time, Situation, Place  O2 Continent Weight: 123 lb 7.3 oz (56 kg) Height:  '5\' 5"'$  (165.1 cm)  BEHAVIORAL SYMPTOMS/MOOD NEUROLOGICAL BOWEL NUTRITION STATUS      Continent Diet (please see discharge summary)  AMBULATORY STATUS COMMUNICATION OF NEEDS Skin     Verbally Normal                       Personal Care Assistance Level  of Assistance  Bathing, Feeding, Dressing Bathing Assistance: Limited assistance Feeding assistance: Independent Dressing Assistance: Limited assistance     Functional Limitations Info  Sight, Hearing, Speech Sight Info: Impaired Hearing Info: Adequate Speech Info: Adequate    SPECIAL CARE FACTORS FREQUENCY  PT (By licensed PT), OT (By licensed OT)     PT Frequency: 5x per week OT Frequency: 5x per week            Contractures Contractures Info: Not present    Additional Factors Info  Code Status, Allergies Code Status Info: DNR Allergies Info: NKA           Current Medications (12/21/2021):  This is the current hospital active medication list Current Facility-Administered Medications  Medication Dose Route Frequency Provider Last Rate Last Admin   apixaban (ELIQUIS) tablet 2.5 mg  2.5 mg Oral BID Lilland, Alana, DO   2.5 mg at 12/21/21 1033   atorvastatin (LIPITOR) tablet 40 mg  40 mg Oral Daily Lilland, Alana, DO   40 mg at 12/21/21 1033   tamsulosin (FLOMAX) capsule 0.4 mg  0.4 mg Oral QPC supper Lilland, Alana, DO   0.4 mg at 12/20/21 1816     Discharge Medications: Please see discharge summary for a list of discharge medications.  Relevant Imaging Results:  Relevant Lab Results:   Additional Information SSN [id number]  Vinie Sill, LCSW

## 2021-12-21 NOTE — Consult Note (Addendum)
Cardiology Consultation   Patient ID: Alejandro Moore MRN: [id number]; DOB: January 21, 1920  Admit date: 12/20/2021 Date of Consult: 12/21/2021  PCP:  Idelle Crouch, MD   [facility] Cardiologist:  Corey Skains, MD        Patient Profile:   Alejandro Moore is a 88 y.o. male with a hx of PAF, chronic HFrEF, moderate-severe MR, mild AS, CAD, HLD, CVA, aortic atherosclerosis, CKD stage 3a, macular degeneration who is being seen 12/21/2021 for the evaluation of CHF at the request of Dr. Lurline Hare.  History of Present Illness:   [name] is followed by [facility]. He was seen in the ED in 2021 for SOB and found to have clinical heart failure with bedside echo showing EF around 40% but he declined admission. Prior to that time he had no significant cardiac history. He followed up with the [facility] NP and was also found to be in atrial fib. Subsequent to that, he followed up with Dr. Nehemiah Massed as an outpatient and was back in [facility]. Formal 2D echo 09/2019 showed EF 30%, mild LVH, severe MR, moderate TR, mild AS. OP note states "The patient has coronary artery disease previously diagnosed by imaging study years ago." CT abd/pelvis in 2021 showed coronary atherosclerosis of the left circumflex and right coronary artery. He has not had a prior stress test or cath. He has been managed medically since that time with Lasix, metoprolol, Eliquis, and atorvastatin. Last echo 07/2020 showed EF 20-25%, mild LVH, moderately reduced RVSF, moderate-severe MR, mild AS (cannot exclude LF/LG AS given decreased EF).   He presented back to the hospital with generalized weakness, mechanical fall and dizziness. There was initial report of LOC which he later denied. He struck his head on the wood floor and sustained abrasions to the top of his head and scalp. He is a poor historian and cannot really tell me any further details beyond "I just fell." He denies any recent CP, SOB, palpitations, or  syncope. However, he was found to have edema and crackles on exam. Initial labs - hsTroponin 159->179->192, Hgb 13.2 -> 11.6, plt 152->103, albumin 2.4, Cr 1.26, Covid/flu negative, BNP 2065. EKG here shows coarse afib versus atrial flutter (was in sinus by last EKG 07/2021). CXR showed mild-moderate pulmonary edema with small bilateral pleural effusions, chronic clavicle injury. Pelvic plain film raised question of L femoral neck fx. CT pelvis showed no displaced fracture or dislocation but suggested that if patient was unable to bear weight or had hip pain, consider MR. CT head/C-spine showed no acute intracranial abnormality, moderately extensive small vessel disease, severe cerebellar atrophy. He also had findings of arthritis, severe sigmoid diverticulosis, and aortic atherosclerosis.  He received '20mg'$  IV Lasix yesterday and subsequent BMET showed hypernatremia of 148, K 5.5 (receiving Lokelma), and Cr 1.30. His legs remain edematous. Today's weight is down about 9lb from May 2023.   Past Medical History:  Diagnosis Date   Actinic keratosis    Aortic atherosclerosis (HCC)    BPH (benign prostatic hyperplasia)    Cataract 11/30/2021   Left Eye   Chronic HFrEF (heart failure with reduced ejection fraction) (HCC)    Chronic kidney disease, stage 3a (HCC)    Coronary artery calcification seen on CT scan    CVA (cerebral vascular accident) ([place])    Macular degeneration    PAF (paroxysmal atrial fibrillation) ([place])     Past Surgical History:  Procedure Laterality Date   KNEE SURGERY  SHOULDER SURGERY       Home Medications:  Prior to Admission medications   Medication Sig Start Date End Date Taking? Authorizing Provider  apixaban (ELIQUIS) 2.5 MG TABS tablet Take 1 tablet (2.5 mg total) by mouth 2 (two) times daily. 07/27/20  Yes Bailey-Modzik, Delila A, NP  atorvastatin (LIPITOR) 40 MG tablet Take 1 tablet (40 mg total) by mouth daily. 07/27/20 12/20/21 Yes Bailey-Modzik, Delila A, NP   esomeprazole (NEXIUM) 20 MG capsule Take 20 mg by mouth every other day.   Yes [provider]  fluticasone (FLONASE) 50 MCG/ACT nasal spray Place 2 sprays into both nostrils daily as needed for allergies or rhinitis.   Yes [provider]  furosemide (LASIX) 20 MG tablet TAKE 1 TABLET BY MOUTH EVERY DAY Patient taking differently: Take 20 mg by mouth every morning. 11/24/19  Yes Darylene Price A, FNP  metoprolol tartrate (LOPRESSOR) 25 MG tablet Take 25 mg by mouth 2 (two) times daily. 09/27/19  Yes [provider]  Multiple Vitamins-Minerals (PRESERVISION AREDS 2) CAPS Take 1 capsule by mouth 2 (two) times daily.   Yes [provider]  OVER THE COUNTER MEDICATION Take 1 packet by mouth every morning. Multivitamin pak - [employer] [facility] [employer]   Yes [provider]  oxymetazoline (AFRIN) 0.05 % nasal spray Place 1 spray into both nostrils 2 (two) times daily as needed (nose bleeds).   Yes [provider]  tamsulosin (FLOMAX) 0.4 MG CAPS capsule Take 0.4 mg by mouth daily after supper. 04/26/19  Yes [provider]  vitamin B-12 1000 MCG tablet Take 1 tablet (1,000 mcg total) by mouth daily. 07/27/20  Yes Bailey-Modzik, Delila A, NP  potassium chloride (KLOR-CON) 10 MEQ tablet TAKE 1 TABLET BY MOUTH EVERY DAY Patient not taking: Reported on 12/20/2021 11/24/19   Alisa Graff, FNP    Inpatient Medications: Scheduled Meds:  apixaban  2.5 mg Oral BID   atorvastatin  40 mg Oral Daily   metoprolol tartrate  25 mg Oral BID   sodium zirconium cyclosilicate  10 g Oral Once   tamsulosin  0.4 mg Oral QPC supper   Continuous Infusions:  PRN Meds:   Allergies:   No Known Allergies  Social History:   Social History   Socioeconomic History   Marital status: Widowed    Spouse name: Not on file   Number of children: 2   Years of education: Not on file   Highest education level: Associate degree: occupational, Hotel manager, or  vocational program  Occupational History   Not on file  Tobacco Use   Smoking status: Never   Smokeless tobacco: Never  Vaping Use   Vaping Use: Never used  Substance and Sexual Activity   Alcohol use: Never   Drug use: Never   Sexual activity: Not on file  Other Topics Concern   Not on file  Social History Narrative   11/02/20 lives with daughter   Social Determinants of Health   Financial Resource Strain: Not on file  Food Insecurity: Not on file  Transportation Needs: Not on file  Physical Activity: Not on file  Stress: Not on file  Social Connections: Not on file  Intimate Partner Violence: Not on file    Family History:   Family History  Problem Relation Age of Onset   Colon cancer Brother      ROS:  Please see the history of present illness.  All other ROS reviewed and negative.     Physical  Exam/Data:   Vitals:   12/20/21 2100 12/20/21 2134 12/21/21 0552 12/21/21 0740  BP:  (!) 122/53 (!) 111/58   Pulse:  (!) 58 82   Resp:  18 (!) 27   Temp: 97.8 F (36.6 C) 97.8 F (36.6 C) 97.9 F (36.6 C) (!) 97.4 F (36.3 C)  TempSrc: Oral Oral Oral Oral  SpO2: 98% 91% 99%   Weight:   56 kg   Height:        Intake/Output Summary (Last 24 hours) at 12/21/2021 1015 Last data filed at 12/21/2021 0300 Gross per 24 hour  Intake 0 ml  Output 100 ml  Net -100 ml      12/21/2021    5:52 AM 12/20/2021   10:24 AM 05/17/2021    1:39 PM  Last 3 Weights  Weight (lbs) 123 lb 7.3 oz 127 lb 137 lb  Weight (kg) 56 kg 57.607 kg 62.143 kg     Body mass index is 20.54 kg/m.  General: Thin elderly frail WM, in no acute distress. Head: Normocephalic, atraumatic, sclera non-icteric, no xanthomas, nares are without discharge. Neck: Negative for carotid bruits. JVP not elevated. Lungs: Diminished at bases without wheezes, rales, or rhonchi. Breathing is unlabored. Heart: irregularly irregular, S1 S2, 2/6 SEM both at RUSB as well as LLSB, apex, without rubs or or gallops.   Abdomen: Soft, non-tender, non-distended with normoactive bowel sounds. No rebound/guarding. Extremities: No clubbing or cyanosis. 1-2+ BLE edema. Distal pedal pulses are 2+ and equal bilaterally. Neuro: Alert and oriented to self, hospital (not specific), not time. Moves all extremities spontaneously. Psych:  Responds to questions appropriately with a normal affect.   EKG:  The EKG was personally reviewed and demonstrates:  coarse atrial fib versus flutter 78bpm, LVH with secondary repol changes, QTC registering as 566m but visually appears shorter  Telemetry:  Telemetry was personally reviewed and demonstrates:  atrial flutter with CVR  Relevant CV Studies:   2D Echo 07/2020   1. Left ventricular ejection fraction, by estimation, is 20 to 25%. The  left ventricle has severely decreased function. The left ventricle  demonstrates global hypokinesis. There is mild concentric left ventricular  hypertrophy. Left ventricular diastolic   parameters are indeterminate. Elevated left atrial pressure.   2. Right ventricular systolic function is moderately reduced. The right  ventricular size is normal.   3. Left atrial size was severely dilated.   4. Right atrial size was moderately dilated.   5. The mitral valve is grossly normal. Moderate to severe mitral valve  regurgitation. Mechanism is likely atrial functional.   6. Tricuspid valve regurgitation is moderate to severe.   7. The aortic valve is calcified. There is moderate calcification of the  aortic valve. There is severe thickening of the aortic valve. Aortic valve  regurgitation is not visualized. At least mild aortic valve stenosis, as  we cannot exclude low flow low  gradient component.   8. The inferior vena cava is normal in size with greater than 50%  respiratory variability, suggesting right atrial pressure of 3 mmHg.   Comparison(s): No prior Echocardiogram.   Conclusion(s)/Recommendation(s): Chart review notes prior  history of reduced LV function NOS; unable to view prior echo. Reaching out to primary team.   Echo CareEverywhere 2021 ECHOCARDIOGRAPHIC DESCRIPTIONS  AORTIC ROOT                   Size: Normal             Dissection: INDETERM FOR  DISSECTION  AORTIC VALVE               Leaflets: Tricuspid                   Morphology: MILDLY THICKENED               Mobility: PARTIALLY MOBILE  LEFT VENTRICLE                   Size: MODERATELY ENLARGED           Anterior: HYPOCONTRACTILE            Contraction: MOD GLOBAL DECREASE            Lateral: HYPOCONTRACTILE             Closest EF: 30% (Estimated)                 Septal: HYPOCONTRACTILE              LV Masses: No Masses                       Apical: HYPOCONTRACTILE                    LVH: MILD LVH                      Inferior: HYPOCONTRACTILE                                                      Posterior: HYPOCONTRACTILE           Dias.FxClass: N/A  MITRAL VALVE               Leaflets: Normal                        Mobility: Fully mobile             Morphology: Normal  LEFT ATRIUM                   Size: MODERATELY ENLARGED          LA Masses: No masses              IA Septum: Normal IAS  MAIN PA                   Size: Normal  PULMONIC VALVE             Morphology: Normal                        Mobility: Fully mobile  RIGHT VENTRICLE              RV Masses: No Masses                         Size: MILDLY ENLARGED              Free Wall: Normal                     Contraction: Normal  TRICUSPID VALVE               Leaflets: Normal  Mobility: Fully mobile             Morphology: Normal  RIGHT ATRIUM                   Size: MODERATELY ENLARGED           RA Other: None                RA Mass: No masses  PERICARDIUM                  Fluid: No effusion                Pleural eff: SMALL PLEURAL EFFUSION  INFERIOR VENACAVA                   Size: DILATED Normal respiratory collapse   _________________________________________________________________________________________   DOPPLER ECHO and OTHER SPECIAL PROCEDURES                 Aortic: No AR                      MILD AS                         250.2 cm/sec peak vel      25.0 mmHg peak grad                         12.3 mmHg mean grad        1.2 cm^2 by DOPPLER                 Mitral: SEVERE MR                  No MS                         MV Inflow E Vel = 96.0 cm/sec      MV Annulus E'Vel = nm*                         E/E'Ratio = nm*              Tricuspid: MODERATE TR                No TS                         366.5 cm/sec peak TR vel   58.9 mmHg peak RV pressure              Pulmonary: TRIVIAL PR                 No PS   INTERPRETATION  MODERATE LV SYSTOLIC DYSFUNCTION (See above)   WITH MILD LVH  NORMAL RIGHT VENTRICULAR SYSTOLIC FUNCTION  SEVERE VALVULAR REGURGITATION (See above)  MILD VALVULAR STENOSIS (See above)   Laboratory Data:  High Sensitivity Troponin:   Recent Labs  Lab 12/20/21 2215 12/21/21 0107 12/21/21 0518  TROPONINIHS 159* 176* 192*     Chemistry Recent Labs  Lab 12/20/21 1035 12/20/21 1240 12/21/21 0107  NA 142 142 148*  K 4.6 4.2 5.5*  CL 99 103 104  CO2  --  32 30  GLUCOSE 114* 107* 96  BUN 56* 38* 35*  CREATININE 1.20 1.26* 1.30*  CALCIUM  --  8.4* 8.8*  GFRNONAA  --  51* 49*  ANIONGAP  --  7 14    Recent Labs  Lab 12/20/21 1240  PROT 5.2*  ALBUMIN 2.4*  AST 33  ALT 35  ALKPHOS 85  BILITOT 1.0   Lipids No results for input(s): "CHOL", "TRIG", "HDL", "LABVLDL", "LDLCALC", "CHOLHDL" in the last 168 hours.  Hematology Recent Labs  Lab 12/20/21 1027 12/20/21 1035 12/21/21 0107  WBC 5.3  --  6.2  RBC 3.67*  --  3.21*  HGB 13.2 15.0 11.6*  HCT 40.6 44.0 36.1*  MCV 110.6*  --  112.5*  MCH 36.0*  --  36.1*  MCHC 32.5  --  32.1  RDW 16.9*  --  16.9*  PLT 152  --  103*   Thyroid No results for input(s): "TSH", "FREET4" in the last 168 hours.   BNP Recent Labs  Lab 12/21/21 0518  BNP 2,065.1*    DDimer No results for input(s): "DDIMER" in the last 168 hours.   Radiology/Studies:  CT PELVIS WO CONTRAST  Result Date: 12/20/2021 CLINICAL DATA:  Hip trauma, fracture suspected. EXAM: CT PELVIS WITHOUT CONTRAST TECHNIQUE: Multidetector CT imaging of the pelvis was performed following the standard protocol without intravenous contrast. RADIATION DOSE REDUCTION: This exam was performed according to the departmental dose-optimization program which includes automated exposure control, adjustment of the mA and/or kV according to patient size and/or use of iterative reconstruction technique. COMPARISON:  Radiographs performed earlier on the same date. FINDINGS: Urinary Tract:  No abnormality visualized. Bowel: Advanced colonic diverticulosis without evidence of acute diverticulitis. Bowel loops are normal in caliber. Vascular/Lymphatic: Advanced atherosclerotic disease of abdominal aorta and branch vessels which are normal in caliber. Reproductive:  No mass or other significant abnormality Other:  None. Musculoskeletal: No displaced fracture or dislocation. Moderate bilateral hip osteoarthritis with joint space narrowing and marginal osteophytes. Advanced degenerate disc disease of the lower lumbar spine. Sacroiliac joints and pubic symphysis are intact. IMPRESSION: 1. No displaced fracture or dislocation. If patient is unable to bear weight and persistent hip pain, further evaluation with MR examination would be helpful to rule out occult fracture. 2.  Moderate bilateral hip osteoarthritis. 3.  Advanced degenerate disc disease of the lumbar spine. 4. Severe sigmoid colonic diverticulosis without evidence of acute diverticulitis. 5.  Atherosclerotic disease of abdominal aorta and branch vessels. Electronically Signed   By: Keane Police D.O.   On: 12/20/2021 14:24   DG Femur Min 2 Views Left  Result Date: 12/20/2021 CLINICAL DATA:  Fall. EXAM: LEFT  FEMUR 2 VIEWS COMPARISON:  None Available. FINDINGS: There is no evidence of fracture or other focal bone lesions. Mild degenerative changes of the left hip and knee with chondrocalcinosis. Osteopenia. Soft tissues are unremarkable. IMPRESSION: 1. No acute osseous abnormality. Electronically Signed   By: Titus Dubin M.D.   On: 12/20/2021 12:15   CT HEAD WO CONTRAST  Result Date: 12/20/2021 CLINICAL DATA:  Head trauma, moderate-severe; Polytrauma, blunt. EXAM: CT HEAD WITHOUT CONTRAST CT CERVICAL SPINE WITHOUT CONTRAST TECHNIQUE: Multidetector CT imaging of the head and cervical spine was performed following the standard protocol without intravenous contrast. Multiplanar CT image reconstructions of the cervical spine were also generated. RADIATION DOSE REDUCTION: This exam was performed according to the departmental dose-optimization program which includes automated exposure control, adjustment of the mA and/or kV according to patient size and/or use of iterative reconstruction technique. COMPARISON:  Head CT and head and neck CTA 07/25/2020. Head MRI 07/26/2020. Cervical spine CT 02/23/2020. FINDINGS: CT HEAD FINDINGS Brain: There is  no evidence of an acute infarct, intracranial hemorrhage, mass, midline shift, or extra-axial fluid collection. Patchy to confluent hypodensities in the cerebral white matter bilaterally are unchanged and nonspecific but compatible with moderately extensive chronic small vessel ischemic disease. A small chronic cortical infarct is again noted in the right occipital lobe. There is mild-to-moderate cerebral and severe cerebellar atrophy. Vascular: Calcified atherosclerosis at the skull base. No hyperdense vessel. Skull: No acute fracture or suspicious osseous lesion. Sinuses/Orbits: Mild mucosal thickening in the paranasal sinuses. No significant mastoid fluid. Left cataract extraction. Other: None. CT CERVICAL SPINE FINDINGS The study is mildly to moderately motion degraded,  greatest at C1-2. Alignment: No evidence of acute traumatic malalignment. Skull base and vertebrae: No acute fracture or suspicious osseous lesion is identified within limitations of motion artifact. Moderate median C1-2 arthropathy. 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 Soft tissues and spinal canal: No prevertebral fluid or swelling. No visible canal hematoma. Disc levels: Moderately advanced disc degeneration at C3-4, C4-5, and C6-7. Advanced facet arthrosis on the left at C2-3 and on the right at C4-5. Moderate bilateral facet arthrosis at C3-4. Mild multilevel neural foraminal stenosis. No evidence of high-grade spinal canal stenosis. Upper chest: Right greater than left lung apical scarring. Small bilateral pleural effusions. Other: Mild calcific atherosclerosis at the carotid bifurcations. IMPRESSION: 1. No evidence of acute intracranial abnormality. 2. Moderately extensive chronic small vessel ischemic disease. Severe cerebellar atrophy. 3. No acute cervical spine fracture identified within limitations of motion artifact. 4. Small bilateral pleural effusions. Electronically Signed   By: Logan Bores M.D.   On: 12/20/2021 11:06   CT CERVICAL SPINE WO CONTRAST  Result Date: 12/20/2021 CLINICAL DATA:  Head trauma, moderate-severe; Polytrauma, blunt. EXAM: CT HEAD WITHOUT CONTRAST CT CERVICAL SPINE WITHOUT CONTRAST TECHNIQUE: Multidetector CT imaging of the head and cervical spine was performed following the standard protocol without intravenous contrast. Multiplanar CT image reconstructions of the cervical spine were also generated. RADIATION DOSE REDUCTION: This exam was performed according to the departmental dose-optimization program which includes automated exposure control, adjustment of the mA and/or kV according to patient size and/or use of iterative reconstruction technique. COMPARISON:  Head CT and head and neck CTA 07/25/2020. Head MRI 07/26/2020. Cervical spine CT 02/23/2020. FINDINGS: CT HEAD FINDINGS Brain:  There is no evidence of an acute infarct, intracranial hemorrhage, mass, midline shift, or extra-axial fluid collection. Patchy to confluent hypodensities in the cerebral white matter bilaterally are unchanged and nonspecific but compatible with moderately extensive chronic small vessel ischemic disease. A small chronic cortical infarct is again noted in the right occipital lobe. There is mild-to-moderate cerebral and severe cerebellar atrophy. Vascular: Calcified atherosclerosis at the skull base. No hyperdense vessel. Skull: No acute fracture or suspicious osseous lesion. Sinuses/Orbits: Mild mucosal thickening in the paranasal sinuses. No significant mastoid fluid. Left cataract extraction. Other: None. CT CERVICAL SPINE FINDINGS The study is mildly to moderately motion degraded, greatest at C1-2. Alignment: No evidence of acute traumatic malalignment. Skull base and vertebrae: No acute fracture or suspicious osseous lesion is identified within limitations of motion artifact. Moderate median C1-2 arthropathy. 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 Soft tissues and spinal canal: No prevertebral fluid or swelling. No visible canal hematoma. Disc levels: Moderately advanced disc degeneration at C3-4, C4-5, and C6-7. Advanced facet arthrosis on the left at C2-3 and on the right at C4-5. Moderate bilateral facet arthrosis at C3-4. Mild multilevel neural foraminal stenosis. No evidence of high-grade spinal canal stenosis. Upper chest: Right greater than left lung apical scarring. Small bilateral pleural  effusions. Other: Mild calcific atherosclerosis at the carotid bifurcations. IMPRESSION: 1. No evidence of acute intracranial abnormality. 2. Moderately extensive chronic small vessel ischemic disease. Severe cerebellar atrophy. 3. No acute cervical spine fracture identified within limitations of motion artifact. 4. Small bilateral pleural effusions. Electronically Signed   By: Logan Bores M.D.   On: 12/20/2021 11:06   DG Pelvis  Portable  Result Date: 12/20/2021 CLINICAL DATA:  Trauma EXAM: PORTABLE PELVIS 1-2 VIEWS COMPARISON:  CT 09/24/2019 FINDINGS: Suboptimally positioned pelvic radiograph to evaluate the femoral necks. There is a possible subcapital fracture of the left femoral neck. There is moderate left and mild right osteoarthritis of the hips. Degenerative changes of the lumbar spine and SI joints. IMPRESSION: Possible subcapital fracture of the left femoral neck. Recommend dedicated hip radiographs or CT of the pelvis. Electronically Signed   By: Maurine Simmering M.D.   On: 12/20/2021 10:52   DG Chest Port 1 View  Result Date: 12/20/2021 CLINICAL DATA:  Trauma EXAM: PORTABLE CHEST 1 VIEW COMPARISON:  None Available. FINDINGS: Mildly enlarged cardiac silhouette. Aortic arch calcifications. There are diffuse interstitial and lower lung predominant alveolar opacities. There are small bilateral pleural effusions. No pneumothorax. There is a double density appearance of the left midclavicle consistent with chronic midclavicle fracture deformity is seen on prior CT of the cervical spine in November 2021. Bilateral shoulder degenerative changes. Thoracic spondylosis. IMPRESSION: Mild to moderate pulmonary edema with small bilateral pleural effusions and adjacent basilar atelectasis. Mild cardiomegaly. Chronic left mid clavicle injury. Electronically Signed   By: Maurine Simmering M.D.   On: 12/20/2021 10:46     Assessment and Plan:   1. Mechanical fall with head injury - plain film with question of hip fx but CT nonacute - per primary team  2. Acute on chronic HFrEF with volume overload also felt contributed by hypoalbuminemia - received one small dose of IV Lasix with subsequent hypernatremia and hyperkalemia - therefore, it is felt that fluid overload is likely multifactorial contributed by low albumin of 2.4 with possible protein-calorie malnutrition  - continue metoprolol as tolerated - avoid ACEI/ARB/ARNI/Spiro given  hyperkalemia, would not add additional GDMT at this time given baseline BPs low-normal at times - repeat echo pending - will discuss diuretic rx with MD  3. Moderate-severe MR, mild AS (though cannot exclude low-flow/low-gradient) by echo 07/2020 - repeat echo pending though anticipate conservative, symptom-based approach as he is not a candidate for invasive aggressive surgical intervention  4. Elevated troponin, h/o coronary calcification without known obstructive CAD - suspect demand ischemia in setting of stressors above, no CP or SOB - given advanced age and frailty, not a candidate for invasive ischemic assessment - follow for angina, denies recent symptoms - avoid ASA given bleeding risk with concomitant Eliquis - statin per primary team  5. Paroxysmal atrial fibrillation, likely atrial flutter here with controlled ventricular response - HR controlled on metoprolol - maintained on Eliquis at home - borderline candidate to continue, will review with MD - would plan for rate control approach over more aggressive rhythm control given advanced age and frailty - addendum 12/21/2021 12:14 PM - notified by nurse that patient had transient bradycardia down to upper 30s a short while ago - tele c/w atrial flutter with SVR-  he was asymptomatic and sitting on edge of bed. Will stop metoprolol and update Dr. Oval Linsey. BP Wnl, 116/68.  6. Stage 3a CKD, today with hyperkalemia - IM managing   Risk Assessment/Risk Scores:  New York Heart Association (NYHA) Functional Class NYHA Class III  CHA2DS2-VASc Score = 6   This indicates a 9.7% annual risk of stroke. The patient's score is based upon: CHF History: 1 HTN History: 0 Diabetes History: 0 Stroke History: 2 Vascular Disease History: 1 Age Score: 2 Gender Score: 0         For questions or updates, please contact [employer] Please consult [email or web address] for contact info under    Signed, Charlie Pitter, PA-C   12/21/2021 10:15 AM

## 2021-12-21 NOTE — Assessment & Plan Note (Addendum)
Resolved after lokelma 10

## 2021-12-21 NOTE — Plan of Care (Signed)
  Problem: Clinical Measurements: Goal: Cardiovascular complication will be avoided Outcome: Progressing   Problem: Coping: Goal: Level of anxiety will decrease Outcome: Progressing   Problem: Safety: Goal: Ability to remain free from injury will improve Outcome: Progressing

## 2021-12-21 NOTE — Evaluation (Signed)
Physical Therapy Evaluation Patient Details Name: Alejandro Moore MRN: [id number] DOB: [birth year] Today's Date: 12/21/2021  History of Present Illness  Pt is a 88 y.o. male who presented 12/20/21 after mechanical fall and acute on chronic CHF. CT of head, L femur, pelvis unremarkable.  PMH:  CHF, Afib, HLD, CKD III, HTN, CAD, macular degeneration, and GERD, CVA's  Clinical Impression  Pt admitted with above diagnosis. Pt received in bed taking O2 off nose because he says it is uncomfortable. O2 left off, SPO2 monitor had difficulty reading but after 20 mins on RA SPO2 down to 79%. 2L O2 reapplied. Pt's HR dropped to 40 bpm before mobility. Maintained in 70's and 80's while mobilizing. Pt noted to have neuromuscular deficits on eval presenting as sudden ceasing of muscular contraction during mvmt. Occurs in UE's and LE's. For example, when eating pt's wrist inversion suddenly ceases, pt nearly drops fork and drops most of food off of it, and then muscle function returns and he is able to bring fork to mouth. Similar occurrence when testing hip flexor strength in bed. Pt reports this has been happening several weeks and he has been getting weaker and weaker. Pt needed min A for bed mobility. Able to come to partial stand to scoot to [facility] but could not fully stand or ambulate. Based on current needs, recommend SNF however, if pt's family prepared for giving min/mod A at home pt could go home.  Pt currently with functional limitations due to the deficits listed below (see PT Problem List). Pt will benefit from skilled PT to increase their independence and safety with mobility to allow discharge to the venue listed below.          Recommendations for follow up therapy are one component of a multi-disciplinary discharge planning process, led by the attending physician.  Recommendations may be updated based on patient status, additional functional criteria and insurance authorization.  Follow Up  Recommendations Skilled nursing-short term rehab (<3 hours/day) Can patient physically be transported by private vehicle: No    Assistance Recommended at Discharge Frequent or constant Supervision/Assistance  Patient can return home with the following  A lot of help with walking and/or transfers;A lot of help with bathing/dressing/bathroom;Assistance with feeding;Assistance with cooking/housework;Direct supervision/assist for medications management;Direct supervision/assist for financial management;Assist for transportation;Help with stairs or ramp for entrance    Equipment Recommendations Wheelchair (measurements PT)  Recommendations for Other Services  OT consult    Functional Status Assessment Patient has had a recent decline in their functional status and demonstrates the ability to make significant improvements in function in a reasonable and predictable amount of time.     Precautions / Restrictions Precautions Precautions: Fall Restrictions Weight Bearing Restrictions: No      Mobility  Bed Mobility Overal bed mobility: Needs Assistance Bed Mobility: Supine to Sit, Sit to Supine     Supine to sit: Min assist Sit to supine: Min assist   General bed mobility comments: min A from HOB 20 deg to come to EOB. Pt able to begin to lift LE's into bed, min A needed to complete task and position in bed    Transfers Overall transfer level: Needs assistance Equipment used: None Transfers: Sit to/from Stand Sit to Stand: Mod assist           General transfer comment: pt attempted sit>stand but pt felt too weak. Scooted along EOB to L with partial stand and then pt exhausted    Ambulation/Gait  General Gait Details: unable  Stairs            Wheelchair Mobility    Modified Rankin (Stroke Patients Only)       Balance Overall balance assessment: Needs assistance Sitting-balance support: Feet supported, No upper extremity supported Sitting  balance-Leahy Scale: Fair Sitting balance - Comments: pt maintained kyphotic posture in sitting with face almost in lunch tray. Able to use UE's for short period of time and then had to rest   Standing balance support: Bilateral upper extremity supported Standing balance-Leahy Scale: Zero Standing balance comment: unable to come to full standing                             Pertinent Vitals/Pain Pain Assessment Pain Assessment: Faces Faces Pain Scale: Hurts little more Pain Location: head Pain Descriptors / Indicators: Sore Pain Intervention(s): Limited activity within patient's tolerance, Monitored during session    Home Living Family/patient expects to be discharged to:: Private residence Living Arrangements: Children Available Help at Discharge: Family;Available 24 hours/day Type of Home: House Home Access: Stairs to enter Entrance Stairs-Rails: None Entrance Stairs-Number of Steps: 5   Home Layout: One level Home Equipment: Hand held shower head Additional Comments: pt reports that he was able to ambulate through house until past few weeks when he got very weak. He reports he does not use AD to ambulate. Home info from previous admission as pt so fatigued he had trouble relaying much info at a time    Prior Function Prior Level of Function : Needs assist             Mobility Comments: pt reports he ambulated without AD, does not drive ADLs Comments: he reports independence with showering on shower seat     Hand Dominance   Dominant Hand: Right    Extremity/Trunk Assessment   Upper Extremity Assessment Upper Extremity Assessment: Defer to OT evaluation    Lower Extremity Assessment Lower Extremity Assessment: Generalized weakness;RLE deficits/detail RLE Deficits / Details: pt able to lift BLE's and BUE's against gravity but cannot sustain contraction and has sudden ceasing of contraction causing jerking with mvmt as well as lack of control. Lacking  neuromuscular control RLE Coordination: decreased gross motor;decreased fine motor    Cervical / Trunk Assessment Cervical / Trunk Assessment: Kyphotic  Communication   Communication: No difficulties  Cognition Arousal/Alertness: Lethargic Behavior During Therapy: WFL for tasks assessed/performed Overall Cognitive Status: No family/caregiver present to determine baseline cognitive functioning                                 General Comments: difficult to assess due to pt fatigue        General Comments General comments (skin integrity, edema, etc.): HR down to 40 bpm before mobility started. Once mobilizing HR remained in 70's and 80's. SPO2 monitor having difficulty reading but finally with good waveform SPO2 79% after 20 mins on RA. 2L O2 reapplied with return to upper 80's    Exercises     Assessment/Plan    PT Assessment Patient needs continued PT services  PT Problem List Decreased strength;Decreased activity tolerance;Decreased balance;Decreased mobility;Decreased knowledge of precautions;Cardiopulmonary status limiting activity       PT Treatment Interventions DME instruction;Gait training;Stair training;Functional mobility training;Therapeutic activities;Therapeutic exercise;Balance training;Patient/family education;Neuromuscular re-education;Cognitive remediation    PT Goals (Current goals can be found in the Care Plan  section)  Acute Rehab PT Goals Patient Stated Goal: return home PT Goal Formulation: With patient Time For Goal Achievement: 01/04/22 Potential to Achieve Goals: Fair    Frequency Min 3X/week     Co-evaluation               AM-PAC PT "6 Clicks" Mobility  Outcome Measure Help needed turning from your back to your side while in a flat bed without using bedrails?: A Little Help needed moving from lying on your back to sitting on the side of a flat bed without using bedrails?: A Little Help needed moving to and from a bed to a  chair (including a wheelchair)?: A Lot Help needed standing up from a chair using your arms (e.g., wheelchair or bedside chair)?: A Lot Help needed to walk in hospital room?: Total Help needed climbing 3-5 steps with a railing? : Total 6 Click Score: 12    End of Session Equipment Utilized During Treatment: Oxygen Activity Tolerance: Patient limited by fatigue;Patient limited by lethargy Patient left: in bed;with call bell/phone within reach;with bed alarm set Nurse Communication: Mobility status PT Visit Diagnosis: Unsteadiness on feet (R26.81);Muscle weakness (generalized) (M62.81);History of falling (Z91.81);Difficulty in walking, not elsewhere classified (R26.2)    Time: 8412-8208 PT Time Calculation (min) (ACUTE ONLY): 40 min   Charges:   PT Evaluation $PT Eval Moderate Complexity: 1 Mod PT Treatments $Therapeutic Activity: 23-37 mins       Leighton Roach, PT  Acute Rehab Services Secure chat preferred Office [place] 12/21/2021, 1:42 PM

## 2021-12-21 NOTE — Progress Notes (Signed)
Daily Progress Note Intern Pager: [phone]  Patient name: Alejandro Moore Medical record number: [id number] Date of birth: July 11, 1919 Age: 88 y.o. Gender: male  Primary Care Provider: Idelle Crouch, MD Consultants: cardiology Code Status: DNR/DNI, confirmed with family   Pt Overview and Major Events to Date:  9/11: Admit FMTS   Assessment and Plan: Alejandro Moore is a 88 y.o. male presenting after a mechanical fall and with acute on chronic HF.  PMHx: CHF (NYHA Class 3), pA fib, CKD stage 3, HLD  * CHF (congestive heart failure), NYHA class III, acute on chronic, systolic (HCC) Acute on chronic systolic HF with pitting edema. Unknown trigger of his acute HF, has had leg swelling recently.  Most recent echo in 07/2020 with LVEF of 20-25% and BNP consistent with HF. Continues to have pitting edema with one time dose of IV lasix. Troponin trended and elevated but patient is continuing to deny chest pain.   - Daily weights, strict I&O's - continue trending troponin - Cardiology consulted, appreciate their recs  - Continue home metoprolol  - Supplemental O2 to maintain sats >90%     Fall at home He had an unwitnessed fall at home with no LOC. He has bruising and bleeding on the top of his scalp. Traumatic w/u in the ED including CT head, DG L femur, DG pelvis, CT pelvis unremarkable. Can consider MRI if unable to bear weight on hip with PT to r/o occult fx. On interview today, he is only oriented to self. This is different from family reported baseline where he is independent, oriented to situations. Possible post-concussive syndrome.  - Wound care for scalp  - Continue to monitor mental status  - SLP recommend dysphagia diet  - can consider MRI for occult fx pending PT eval  Hyperkalemia Elevated K after 1 time dose of lasix yesterday. Given his acute on chronic HF and CKD, will do gentle diuresis.  -ordered lokelma 10 for hyperkalemia   Paroxysmal A-fib ([place]) EKG  with Afib on admission. Rate controlled at home with Metoprolol  - Continue home Eliquis 2.'5mg'$  BID - Continue home Metoprolol tartrate '25mg'$  BID - Continuous cardiac monitoring  BPH with urinary obstruction Patient with history of BPH and only 154m reported urine output.  -on tamsulosin -ordered bladder scan to evaluate for urinary retention    FEN/GI: NPO pending SLP eval  PPx: on eliquis  Dispo:Pending PT recommendations  pending clinical improvement . Barriers include ongoing medical care.   Subjective:  No family at bedside. Patient's mental status is unchanged, continues to only be oriented to self. He denies any acute pain.   Objective: Temp:  [97.4 F (36.3 C)-97.9 F (36.6 C)] 97.4 F (36.3 C) (09/12 0740) Pulse Rate:  [52-86] 64 (09/12 1215) Resp:  [13-27] 13 (09/12 1215) BP: (110-134)/(48-100) 116/68 (09/12 1215) SpO2:  [91 %-100 %] 99 % (09/12 0552) Weight:  [56 kg] 56 kg (09/12 0552) Output: 1048movernight.  Resp: 99% on 3L O2   Physical Exam: General: emaciated appearing male laying in bed with eyes closed on O2, in no acute distress  Cardiovascular: systolic murmur. Regular rate and rhythm.  Respiratory: Slight crackles at base.  Abdomen: Soft, non-tender, nondistended  Extremities: 3+ edema in extremities   Laboratory: Most recent CBC Lab Results  Component Value Date   WBC 6.2 12/21/2021   HGB 11.6 (L) 12/21/2021   HCT 36.1 (L) 12/21/2021   MCV 112.5 (H) 12/21/2021   PLT 103 (L) 12/21/2021  Most recent BMP    Latest Ref Rng & Units 12/21/2021    1:07 AM  BMP  Glucose 70 - 99 mg/dL 96   BUN 8 - 23 mg/dL 35   Creatinine 0.61 - 1.24 mg/dL 1.30   Sodium 135 - 145 mmol/L 148   Potassium 3.5 - 5.1 mmol/L 5.5   Chloride 98 - 111 mmol/L 104   CO2 22 - 32 mmol/L 30   Calcium 8.9 - 10.3 mg/dL 8.8     Other pertinent labs: BNP 2065, troponin 159 -> 176 -> 192   Imaging/Diagnostic Tests: Echo: LVEF 35-40%, improved from prior in 4/22 with EF  20-25%.   Rolanda Lundborg, MD 12/21/2021, 12:27 PM  PGY-1, [place] Intern pager: [phone], text pages welcome Secure chat group [place]

## 2021-12-21 NOTE — Progress Notes (Signed)
Pt had brady down to 37 once around 11:53 am. Asymptomatic. Notified cardiology.

## 2021-12-21 NOTE — TOC Initial Note (Signed)
Transition of Care [facility]) - Initial/Assessment Note    Patient Details  Name: Alejandro Moore MRN: [id number] Date of Birth: [birth year]  Transition of [facility]) CM/SW Contact:    Bethann Berkshire, [place] Phone Number: 12/21/2021, 4:08 PM  Clinical Narrative:                  CSW met with pt to discuss disposition. CSW explains SNF recommendation. CSW explains SNF workup process and answers pt questions. Pt is ambivalent about SNF. States he lives at home with his daughter and son in law in [place]. Pt's son in law arrives to room shortly after. CSW is able to speak with pt along with son in law. Son in law is agreeable to SNF though states [name] the [facility] one they would consider. Pt is agreeable to SNF at [facility] after speaking with son in law.  CSW will complete fl2 and fax referral to [facility].    Expected Discharge Plan: Skilled Nursing Facility Barriers to Discharge: Continued Medical Work up, SNF Pending bed offer   Patient Goals and CMS Choice        Expected Discharge Plan and Services Expected Discharge Plan: [place] arrangements for the past 2 months: Single Family Home                                      Prior Living Arrangements/Services Living arrangements for the past 2 months: Single Family Home Lives with:: Adult Children, Relatives Patient language and need for interpreter reviewed:: Yes        Need for Family Participation in Patient Care: Yes (Comment) Care giver support system in place?: Yes (comment)   Criminal Activity/Legal Involvement Pertinent to Current Situation/Hospitalization: No - Comment as needed  Activities of Daily Living      Permission Sought/Granted   Permission granted to share information with : Yes, Verbal Permission Granted  Share Information with NAME: [place], Amy Daughter, Son-in-law           Emotional Assessment Appearance:: Appears stated  age Attitude/Demeanor/Rapport: Lethargic Affect (typically observed): Appropriate Orientation: : Oriented to Self, Oriented to Place, Oriented to Situation Alcohol / Substance Use: Not Applicable Psych Involvement: No (comment)  Admission diagnosis:  Fall, initial encounter [W19.XXXA] CHF (congestive heart failure), NYHA class III, acute on chronic, systolic (HCC) [I50.27] Acute on chronic congestive heart failure, unspecified heart failure type (HCC) [I50.9] CHF (congestive heart failure) ([place]) [I50.9] Patient Active Problem List   Diagnosis Date Noted   Hyperkalemia 12/21/2021   CHF (congestive heart failure) ([place]) 12/21/2021   CHF (congestive heart failure), NYHA class III, acute on chronic, systolic ([place]) 74/03/8785   Fall at home 12/20/2021   Stroke (cerebrum) ([place]) 07/25/2020   Paroxysmal A-fib ([place]) 02/17/2020   Atherosclerosis of abdominal aorta ([place]) 09/27/2019   Coronary artery disease involving native coronary artery of native heart 09/27/2019   Pleural effusion 76/72/0947   Acute systolic CHF (congestive heart failure) ([place]) 09/06/2019   CKD (chronic kidney disease) stage 3, GFR 30-59 ml/min ([place]) 09/06/2019   BPH with urinary obstruction 04/26/2019   Environmental allergies 04/26/2019   Gastroesophageal reflux disease without esophagitis 04/26/2019   Nonexudative age-related macular degeneration 04/26/2019   PCP:  Idelle Crouch, MD Pharmacy:   [id number] [place] [id number]:  [phone] Fax: [phone]     Social Determinants of Health (SDOH) Interventions    Readmission Risk Interventions     No data to display

## 2021-12-21 NOTE — Evaluation (Signed)
Occupational Therapy Evaluation Patient Details Name: Alejandro Moore MRN: [id number] DOB: 1919-09-04 Today's Date: 12/21/2021   History of Present Illness Pt is a 88 y.o. male who presented 12/20/21 after mechanical fall and acute on chronic CHF. CT of head, L femur, pelvis unremarkable.  PMH:  CHF, Afib, HLD, CKD III, HTN, CAD, macular degeneration, and GERD, CVA's   Clinical Impression   Pt repots independent at baseline with ADLs and functional mobility, lives with family who can provide assist at home. Pt currently needing min-max A for ADLs, min A for bed mobility, and min A for lateral scooting at EOB. Pt lethargic, keeping eyes closed for most of session, needing increased cues to perform tasks. Pt presenting with impairments listed below, will follow acutely. Recommend SNF at d/c.      Recommendations for follow up therapy are one component of a multi-disciplinary discharge planning process, led by the attending physician.  Recommendations may be updated based on patient status, additional functional criteria and insurance authorization.   Follow Up Recommendations  Skilled nursing-short term rehab (<3 hours/day)    Assistance Recommended at Discharge Frequent or constant Supervision/Assistance  Patient can return home with the following A lot of help with walking and/or transfers;A lot of help with bathing/dressing/bathroom;Assistance with cooking/housework;Help with stairs or ramp for entrance;Assist for transportation    Functional Status Assessment  Patient has had a recent decline in their functional status and demonstrates the ability to make significant improvements in function in a reasonable and predictable amount of time.  Equipment Recommendations  Other (comment);None recommended by OT (defer)    Recommendations for Other Services PT consult     Precautions / Restrictions Precautions Precautions: Fall Restrictions Weight Bearing Restrictions: No       Mobility Bed Mobility Overal bed mobility: Needs Assistance Bed Mobility: Supine to Sit, Sit to Supine     Supine to sit: Min assist Sit to supine: Min assist        Transfers Overall transfer level: Needs assistance Equipment used: None Transfers: Bed to chair/wheelchair/BSC            Lateral/Scoot Transfers: Min assist General transfer comment: scooting x3 at EOB      Balance Overall balance assessment: Needs assistance Sitting-balance support: Feet supported, No upper extremity supported Sitting balance-Leahy Scale: Fair Sitting balance - Comments: kyphotic seated posture                                   ADL either performed or assessed with clinical judgement   ADL Overall ADL's : Needs assistance/impaired Eating/Feeding: Minimal assistance   Grooming: Minimal assistance   Upper Body Bathing: Moderate assistance   Lower Body Bathing: Maximal assistance   Upper Body Dressing : Moderate assistance   Lower Body Dressing: Maximal assistance   Toilet Transfer: Moderate assistance   Toileting- Clothing Manipulation and Hygiene: Moderate assistance       Functional mobility during ADLs: Moderate assistance       Vision Baseline Vision/History: 6 Macular Degeneration Additional Comments: pt with eyes closed for most of session, hx of macular degeneration, will further assess     Perception     Praxis      Pertinent Vitals/Pain Pain Assessment Pain Assessment: No/denies pain Pain Intervention(s): Limited activity within patient's tolerance, Monitored during session     Hand Dominance Right   Extremity/Trunk Assessment Upper Extremity Assessment Upper Extremity Assessment: Generalized weakness  Lower Extremity Assessment Lower Extremity Assessment: Defer to PT evaluation RLE Deficits / Details: pt able to lift BLE's and BUE's against gravity but cannot sustain contraction and has sudden ceasing of contraction causing  jerking with mvmt as well as lack of control. Lacking neuromuscular control RLE Coordination: decreased gross motor;decreased fine motor   Cervical / Trunk Assessment Cervical / Trunk Assessment: Kyphotic   Communication Communication Communication: No difficulties   Cognition Arousal/Alertness: Lethargic Behavior During Therapy: WFL for tasks assessed/performed, Flat affect Overall Cognitive Status: Impaired/Different from baseline Area of Impairment: Memory, Orientation, Following commands, Awareness, Problem solving, Safety/judgement                 Orientation Level: Disoriented to, Place   Memory: Decreased short-term memory Following Commands: Follows one step commands with increased time Safety/Judgement: Decreased awareness of safety Awareness: Emergent Problem Solving: Slow processing, Requires verbal cues, Requires tactile cues, Decreased initiation General Comments: pt needing increased cues to perform simple tasks     General Comments  VSS on supplemental O2, family in room upon arrival    Exercises     Shoulder Instructions      Home Living Family/patient expects to be discharged to:: Private residence Living Arrangements: Children Available Help at Discharge: Family;Available 24 hours/day Type of Home: House Home Access: Stairs to enter [employer] of Steps: 5 Entrance Stairs-Rails: None Home Layout: One level     Bathroom Shower/Tub: Occupational psychologist: Standard     Home Equipment: Hand held shower head;Shower seat   Additional Comments: pt reports that he was able to ambulate through house until past few weeks when he got very weak. He reports he does not use AD to ambulate. Home info from previous admission as pt so fatigued he had trouble relaying much info at a time      Prior Functioning/Environment Prior Level of Function : Needs assist             Mobility Comments: pt reports he ambulated without AD,  does not drive ADLs Comments: he reports independence with showering on shower seat        OT Problem List: Decreased activity tolerance;Decreased range of motion;Decreased strength;Impaired balance (sitting and/or standing);Decreased cognition;Decreased safety awareness      OT Treatment/Interventions: Self-care/ADL training;Therapeutic exercise;DME and/or AE instruction;Therapeutic activities;Patient/family education;Balance training    OT Goals(Current goals can be found in the care plan section) Acute Rehab OT Goals Patient Stated Goal: none stated OT Goal Formulation: With patient Time For Goal Achievement: 01/04/22 Potential to Achieve Goals: Good ADL Goals Pt Will Perform Grooming: with supervision;sitting Pt Will Perform Upper Body Dressing: with min assist;sitting Pt Will Perform Lower Body Dressing: with min guard assist;sit to/from stand;sitting/lateral leans Pt Will Transfer to Toilet: with min assist;bedside commode;stand pivot transfer;squat pivot transfer Additional ADL Goal #1: pt will perform bed mobility mod I in prep for ADLs  OT Frequency: Min 2X/week    Co-evaluation              AM-PAC OT "6 Clicks" Daily Activity     Outcome Measure Help from another person eating meals?: A Little Help from another person taking care of personal grooming?: A Little Help from another person toileting, which includes using toliet, bedpan, or urinal?: A Lot Help from another person bathing (including washing, rinsing, drying)?: A Lot Help from another person to put on and taking off regular upper body clothing?: A Lot Help from another person to put on and  taking off regular lower body clothing?: A Lot 6 Click Score: 14   End of Session Equipment Utilized During Treatment: Oxygen Nurse Communication: Mobility status;Other (comment) (pt's IV bleeding)  Activity Tolerance: Patient tolerated treatment well Patient left: in bed;with call bell/phone within reach;with bed  alarm set;with family/visitor present  OT Visit Diagnosis: Unsteadiness on feet (R26.81);Other abnormalities of gait and mobility (R26.89);Muscle weakness (generalized) (M62.81)                Time: 1610-9604 OT Time Calculation (min): 19 min Charges:  OT General Charges $OT Visit: 1 Visit OT Evaluation $OT Eval Moderate Complexity: 1 [address] OTD, OTR/L Acute Rehab [phone]) 832 - [place] 12/21/2021, 3:55 PM

## 2021-12-21 NOTE — Evaluation (Signed)
Clinical/Bedside Swallow Evaluation Patient Details  Name: Alejandro Moore MRN: [id number] Date of Birth: 1919/09/25  Today's Date: 12/21/2021 Time: SLP Start Time (ACUTE ONLY): 0907 SLP Stop Time (ACUTE ONLY): 0933 SLP Time Calculation (min) (ACUTE ONLY): 26 min  Past Medical History:  Past Medical History:  Diagnosis Date   Actinic keratosis    BPH (benign prostatic hyperplasia)    Cataract 11/30/2021   Left Eye   CVA (cerebral vascular accident) ([employer])    Macular degeneration    Past Surgical History:  Past Surgical History:  Procedure Laterality Date   KNEE SURGERY     SHOULDER SURGERY     HPI:  Pt is a 88 yo male presenting s/p GLF with acute on chronic HF. PMH includes: CVA, macular degeneration, cataract, BPH, actinic keratosis    Assessment / Plan / Recommendation  Clinical Impression  Pt had a very dry mouth at baseline and had a little coughing at first, but appeared to "warm up" after he got a little bit of moisture back into his mouth/throat. Throughout the remainder of trials, pt primarily had moments of eructation and occasional, delayed throat clearing, which could be suggestive of possible baseline esophageal component. He has some trouble biting through a cracker but masticates it well when broken into more bite-sized pieces. Recommend starting wtih Dys 3 diet and thin liquids with careful monitoring, as pt may also need assistance with self-feeding as well. SLP will continue to follow at least briefly. SLP Visit Diagnosis: Dysphagia, unspecified (R13.10)    Aspiration Risk  Mild aspiration risk;Moderate aspiration risk    Diet Recommendation Dysphagia 3 (Mech soft);Thin liquid   Liquid Administration via: Cup;Straw Medication Administration: Whole meds with puree Supervision: Staff to assist with self feeding;Full supervision/cueing for compensatory strategies Compensations: Minimize environmental distractions;Slow rate;Small sips/bites Postural Changes:  Seated upright at 90 degrees;Remain upright for at least 30 minutes after po intake    Other  Recommendations Oral Care Recommendations: Oral care BID    Recommendations for follow up therapy are one component of a multi-disciplinary discharge planning process, led by the attending physician.  Recommendations may be updated based on patient status, additional functional criteria and insurance authorization.  Follow up Recommendations  (tba)      Assistance Recommended at Discharge Frequent or constant Supervision/Assistance  Functional Status Assessment Patient has had a recent decline in their functional status and demonstrates the ability to make significant improvements in function in a reasonable and predictable amount of time.  Frequency and Duration min 2x/week  1 week       Prognosis Prognosis for Safe Diet Advancement: Good Barriers to Reach Goals: Cognitive deficits      Swallow Study   General HPI: Pt is a 88 yo male presenting s/p GLF with acute on chronic HF. PMH includes: CVA, macular degeneration, cataract, BPH, actinic keratosis Type of Study: Bedside Swallow Evaluation Previous Swallow Assessment: none in chart Diet Prior to this Study: NPO Temperature Spikes Noted: No Respiratory Status: Nasal cannula History of Recent Intubation: No Behavior/Cognition: Alert;Cooperative;Pleasant mood Oral Cavity Assessment: Dry Oral Care Completed by SLP: No Oral Cavity - Dentition: Dentures, top;Dentures, bottom Vision: Impaired for self-feeding Self-Feeding Abilities: Able to feed self Patient Positioning: Upright in bed Baseline Vocal Quality: Normal Volitional Cough: Weak Volitional Swallow: Able to elicit    Oral/Motor/Sensory Function Overall Oral Motor/Sensory Function:  (some trouble with command following, generalized weakness suspected but without focal deficits overtly noted)   Ice Chips     Thin Liquid  Thin Liquid: Impaired Presentation: Self  Fed;Cup;Straw Pharyngeal  Phase Impairments: Cough - Immediate;Throat Clearing - Delayed    Nectar Thick Nectar Thick Liquid: Not tested   Honey Thick Honey Thick Liquid: Not tested   Puree Puree: Impaired Presentation: Spoon Pharyngeal Phase Impairments: Throat Clearing - Delayed   Solid     Solid: Impaired Oral Phase Impairments: Other (comment) (trouble biting through cracker, but then able to masticate it)      Osie Bond., M.A. [place] Office [phone]  Secure chat preferred  12/21/2021,9:42 AM

## 2021-12-21 NOTE — Assessment & Plan Note (Addendum)
Patient with history of BPH and 475 mL of output yesterday. Bladder scan with 15 mL, so less suspicious for urinary retention.  -on tamsulosin

## 2021-12-22 ENCOUNTER — Inpatient Hospital Stay (HOSPITAL_COMMUNITY): Payer: Medicare Other

## 2021-12-22 DIAGNOSIS — I5043 Acute on chronic combined systolic (congestive) and diastolic (congestive) heart failure: Secondary | ICD-10-CM | POA: Diagnosis not present

## 2021-12-22 DIAGNOSIS — W19XXXD Unspecified fall, subsequent encounter: Secondary | ICD-10-CM | POA: Diagnosis not present

## 2021-12-22 DIAGNOSIS — W19XXXA Unspecified fall, initial encounter: Secondary | ICD-10-CM | POA: Diagnosis not present

## 2021-12-22 DIAGNOSIS — Y92009 Unspecified place in unspecified non-institutional (private) residence as the place of occurrence of the external cause: Secondary | ICD-10-CM

## 2021-12-22 DIAGNOSIS — I480 Paroxysmal atrial fibrillation: Secondary | ICD-10-CM | POA: Diagnosis not present

## 2021-12-22 DIAGNOSIS — E639 Nutritional deficiency, unspecified: Secondary | ICD-10-CM

## 2021-12-22 LAB — CBC
HCT: 34.6 % — ABNORMAL LOW (ref 39.0–52.0)
Hemoglobin: 11.2 g/dL — ABNORMAL LOW (ref 13.0–17.0)
MCH: 36.2 pg — ABNORMAL HIGH (ref 26.0–34.0)
MCHC: 32.4 g/dL (ref 30.0–36.0)
MCV: 112 fL — ABNORMAL HIGH (ref 80.0–100.0)
Platelets: 103 10*3/uL — ABNORMAL LOW (ref 150–400)
RBC: 3.09 MIL/uL — ABNORMAL LOW (ref 4.22–5.81)
RDW: 16.5 % — ABNORMAL HIGH (ref 11.5–15.5)
WBC: 6.5 10*3/uL (ref 4.0–10.5)
nRBC: 0 % (ref 0.0–0.2)

## 2021-12-22 LAB — URINALYSIS, ROUTINE W REFLEX MICROSCOPIC
Bilirubin Urine: NEGATIVE
Glucose, UA: NEGATIVE mg/dL
Hgb urine dipstick: NEGATIVE
Ketones, ur: 5 mg/dL — AB
Nitrite: NEGATIVE
Protein, ur: 30 mg/dL — AB
Specific Gravity, Urine: 1.02 (ref 1.005–1.030)
pH: 7 (ref 5.0–8.0)

## 2021-12-22 LAB — BASIC METABOLIC PANEL
Anion gap: 8 (ref 5–15)
BUN: 32 mg/dL — ABNORMAL HIGH (ref 8–23)
CO2: 35 mmol/L — ABNORMAL HIGH (ref 22–32)
Calcium: 8.5 mg/dL — ABNORMAL LOW (ref 8.9–10.3)
Chloride: 100 mmol/L (ref 98–111)
Creatinine, Ser: 1.31 mg/dL — ABNORMAL HIGH (ref 0.61–1.24)
GFR, Estimated: 49 mL/min — ABNORMAL LOW (ref >60)
Glucose, Bld: 118 mg/dL — ABNORMAL HIGH (ref 70–99)
Potassium: 4 mmol/L (ref 3.5–5.1)
Sodium: 143 mmol/L (ref 135–145)

## 2021-12-22 MED ORDER — FUROSEMIDE 10 MG/ML IJ SOLN
40.0000 mg | Freq: Two times a day (BID) | INTRAMUSCULAR | Status: DC
  Administered 2021-12-22 – 2021-12-23 (×3): 40 mg via INTRAVENOUS
  Filled 2021-12-22 (×3): qty 4

## 2021-12-22 MED ORDER — METOPROLOL TARTRATE 12.5 MG HALF TABLET
12.5000 mg | ORAL_TABLET | Freq: Two times a day (BID) | ORAL | Status: DC
  Administered 2021-12-22 – 2021-12-24 (×4): 12.5 mg via ORAL
  Filled 2021-12-22 (×4): qty 1

## 2021-12-22 NOTE — Progress Notes (Addendum)
Rounding Note    Patient Name: Alejandro Moore Date of Encounter: 12/22/2021  [place] Cardiologist: Corey Skains, MD   Subjective   Hypoxic at 84-86% after off oxygen.  No family at bedside.   Inpatient Medications    Scheduled Meds:  apixaban  2.5 mg Oral BID   atorvastatin  40 mg Oral Daily   feeding supplement  237 mL Oral BID BM   tamsulosin  0.4 mg Oral QPC supper   Continuous Infusions:  PRN Meds:    Vital Signs    Vitals:   12/21/21 1923 12/22/21 0425 12/22/21 0450 12/22/21 0800  BP: 118/67 119/63  129/66  Pulse: 85 85  84  Resp: '20 20  18  '$ Temp: 98.6 F (37 C) 98.3 F (36.8 C)  97.8 F (36.6 C)  TempSrc: Oral Oral  Oral  SpO2: 91% 92%  95%  Weight:   53.5 kg   Height:        Intake/Output Summary (Last 24 hours) at 12/22/2021 6301 Last data filed at 12/22/2021 0001 Gross per 24 hour  Intake 360 ml  Output 475 ml  Net -115 ml      12/22/2021    4:50 AM 12/21/2021    5:52 AM 12/20/2021   10:24 AM  Last 3 Weights  Weight (lbs) 117 lb 15.1 oz 123 lb 7.3 oz 127 lb  Weight (kg) 53.5 kg 56 kg 57.607 kg      Telemetry    Atrial fibrillation at controlled rate  - Personally Reviewed  ECG    N/A  Physical Exam   GEN: Elderly frail ill appearing male in no acute distress.   Neck: No JVD Cardiac: Irregular, no murmurs, rubs, or gallops.  Respiratory: Clear to auscultation bilaterally. GI: Soft, nontender, non-distended  MS: + edema; No deformity. Neuro:  Nonfocal  Psych: Somnolent  Labs    High Sensitivity Troponin:   Recent Labs  Lab 12/20/21 2215 12/21/21 0107 12/21/21 0518 12/21/21 1114 12/21/21 1434  TROPONINIHS 159* 176* 192* 208* 205*     Chemistry Recent Labs  Lab 12/20/21 1240 12/21/21 0107 12/21/21 1433 12/22/21 0501  NA 142 148* 144 143  K 4.2 5.5* 4.4 4.0  CL 103 104 101 100  CO2 32 30 32 35*  GLUCOSE 107* 96 111* 118*  BUN 38* 35* 36* 32*  CREATININE 1.26* 1.30* 1.35* 1.31*  CALCIUM  8.4* 8.8* 8.4* 8.5*  PROT 5.2*  --   --   --   ALBUMIN 2.4*  --   --   --   AST 33  --   --   --   ALT 35  --   --   --   ALKPHOS 85  --   --   --   BILITOT 1.0  --   --   --   GFRNONAA 51* 49* 47* 49*  ANIONGAP '7 14 11 8    '$ Lipids No results for input(s): "CHOL", "TRIG", "HDL", "LABVLDL", "LDLCALC", "CHOLHDL" in the last 168 hours.  Hematology Recent Labs  Lab 12/20/21 1027 12/20/21 1035 12/21/21 0107 12/22/21 0501  WBC 5.3  --  6.2 6.5  RBC 3.67*  --  3.21* 3.09*  HGB 13.2 15.0 11.6* 11.2*  HCT 40.6 44.0 36.1* 34.6*  MCV 110.6*  --  112.5* 112.0*  MCH 36.0*  --  36.1* 36.2*  MCHC 32.5  --  32.1 32.4  RDW 16.9*  --  16.9* 16.5*  PLT 152  --  103* 103*   Thyroid No results for input(s): "TSH", "FREET4" in the last 168 hours.  BNP Recent Labs  Lab 12/21/21 0518  BNP 2,065.1*    DDimer No results for input(s): "DDIMER" in the last 168 hours.   Radiology    ECHOCARDIOGRAM COMPLETE  Result Date: 12/21/2021    ECHOCARDIOGRAM REPORT   Patient Name:   Alejandro Moore Date of Exam: 12/21/2021 Medical Rec #:  [id number]         Height:       65.0 in Accession #:    [id number]        Weight:       123.5 lb Date of Birth:  21-Dec-1919         BSA:          1.611 m Patient Age:    88 years         BP:           00/00 mmHg Patient Gender: M                 HR:           86 bpm. Exam Location:  Inpatient Procedure: 2D Echo, Cardiac Doppler and Color Doppler Indications:    CHF  History:        Patient has prior history of Echocardiogram examinations, most                 recent 07/26/2020.  Sonographer:    Jefferey Pica Referring Phys: [id number] [place]  1. Left ventricular ejection fraction, by estimation, is 35 to 40%. The left ventricle has moderately decreased function. The left ventricle demonstrates global hypokinesis. There is mild concentric left ventricular hypertrophy. Left ventricular diastolic function could not be evaluated.  2. Right ventricular systolic  function is moderately reduced. The right ventricular size is mildly enlarged. There is moderately elevated pulmonary artery systolic pressure. The estimated right ventricular systolic pressure is 61.6 mmHg.  3. Left atrial size was severely dilated.  4. Right atrial size was severely dilated.  5. The mitral valve is grossly normal. Trivial mitral valve regurgitation. No evidence of mitral stenosis.  6. The tricuspid valve is abnormal. Tricuspid valve regurgitation is mild to moderate.  7. The aortic valve is tricuspid. There is moderate calcification of the aortic valve. There is moderate thickening of the aortic valve. Aortic valve regurgitation is not visualized. Mild to moderate aortic valve stenosis. Aortic valve area, by VTI measures 1.65 cm. Aortic valve mean gradient measures 17.3 mmHg. Aortic valve Vmax measures 2.96 m/s.  8. The inferior vena cava is normal in size with <50% respiratory variability, suggesting right atrial pressure of 8 mmHg. FINDINGS  Left Ventricle: Left ventricular ejection fraction, by estimation, is 35 to 40%. The left ventricle has moderately decreased function. The left ventricle demonstrates global hypokinesis. The left ventricular internal cavity size was normal in size. There is mild concentric left ventricular hypertrophy. Left ventricular diastolic function could not be evaluated due to atrial fibrillation. Left ventricular diastolic function could not be evaluated. Right Ventricle: The right ventricular size is mildly enlarged. No increase in right ventricular wall thickness. Right ventricular systolic function is moderately reduced. There is moderately elevated pulmonary artery systolic pressure. The tricuspid regurgitant velocity is 3.32 m/s, and with an assumed right atrial pressure of 8 mmHg, the estimated right ventricular systolic pressure is 07.3 mmHg. Left Atrium: Left atrial size was severely dilated. Right Atrium: Right atrial size was severely dilated. Pericardium:  There is no evidence of pericardial effusion. Mitral Valve: The mitral valve is grossly normal. Trivial mitral valve regurgitation. No evidence of mitral valve stenosis. Tricuspid Valve: The tricuspid valve is abnormal. Tricuspid valve regurgitation is mild to moderate. No evidence of tricuspid stenosis. Aortic Valve: The aortic valve is tricuspid. There is moderate calcification of the aortic valve. There is moderate thickening of the aortic valve. Aortic valve regurgitation is not visualized. Mild to moderate aortic stenosis is present. Aortic valve mean gradient measures 17.3 mmHg. Aortic valve peak gradient measures 35.0 mmHg. Aortic valve area, by VTI measures 1.65 cm. Pulmonic Valve: The pulmonic valve was grossly normal. Pulmonic valve regurgitation is trivial. No evidence of pulmonic stenosis. Aorta: The aortic root and ascending aorta are structurally normal, with no evidence of dilitation. Venous: The inferior vena cava is normal in size with less than 50% respiratory variability, suggesting right atrial pressure of 8 mmHg. IAS/Shunts: The atrial septum is grossly normal.  LEFT VENTRICLE PLAX 2D LVIDd:         4.70 cm      Diastology LVIDs:         4.10 cm      LV e' lateral:   7.32 cm/s LV PW:         1.40 cm      LV E/e' lateral: 13.6 LV IVS:        1.30 cm LVOT diam:     2.00 cm LV SV:         85 LV SV Index:   53 LVOT Area:     3.14 cm  LV Volumes (MOD) LV vol d, MOD A4C: 132.0 ml LV vol s, MOD A4C: 81.7 ml LV SV MOD A4C:     132.0 ml RIGHT VENTRICLE          IVC RV Basal diam:  3.70 cm  IVC diam: 1.90 cm RV Mid diam:    3.00 cm LEFT ATRIUM             Index        RIGHT ATRIUM           Index LA diam:        4.20 cm 2.61 cm/m   RA Area:     26.90 cm LA Vol (A2C):   83.6 ml 51.88 ml/m  RA Volume:   103.00 ml 63.92 ml/m LA Vol (A4C):   83.6 ml 51.88 ml/m LA Biplane Vol: 84.1 ml 52.19 ml/m  AORTIC VALVE                     PULMONIC VALVE AV Area (Vmax):    1.61 cm      PV Vmax:       0.73 m/s AV  Area (Vmean):   1.46 cm      PV Peak grad:  2.1 mmHg AV Area (VTI):     1.65 cm AV Vmax:           296.00 cm/s AV Vmean:          188.667 cm/s AV VTI:            0.516 m AV Peak Grad:      35.0 mmHg AV Mean Grad:      17.3 mmHg LVOT Vmax:         152.00 cm/s LVOT Vmean:        87.900 cm/s LVOT VTI:          0.271 m LVOT/AV  VTI ratio: 0.53  AORTA Ao Root diam: 3.50 cm Ao Asc diam:  3.00 cm MITRAL VALVE               TRICUSPID VALVE MV Area (PHT): 6.54 cm    TR Peak grad:   44.1 mmHg MV Decel Time: 116 msec    TR Vmax:        332.00 cm/s MV E velocity: 99.40 cm/s MV A velocity: 43.20 cm/s  SHUNTS MV E/A ratio:  2.30        Systemic VTI:  0.27 m                            Systemic Diam: 2.00 cm Eleonore Chiquito MD Electronically signed by Eleonore Chiquito MD Signature Date/Time: 12/21/2021/10:28:58 AM    Final    CT PELVIS WO CONTRAST  Result Date: 12/20/2021 CLINICAL DATA:  Hip trauma, fracture suspected. EXAM: CT PELVIS WITHOUT CONTRAST TECHNIQUE: Multidetector CT imaging of the pelvis was performed following the standard protocol without intravenous contrast. RADIATION DOSE REDUCTION: This exam was performed according to the departmental dose-optimization program which includes automated exposure control, adjustment of the mA and/or kV according to patient size and/or use of iterative reconstruction technique. COMPARISON:  Radiographs performed earlier on the same date. FINDINGS: Urinary Tract:  No abnormality visualized. Bowel: Advanced colonic diverticulosis without evidence of acute diverticulitis. Bowel loops are normal in caliber. Vascular/Lymphatic: Advanced atherosclerotic disease of abdominal aorta and branch vessels which are normal in caliber. Reproductive:  No mass or other significant abnormality Other:  None. Musculoskeletal: No displaced fracture or dislocation. Moderate bilateral hip osteoarthritis with joint space narrowing and marginal osteophytes. Advanced degenerate disc disease of the lower  lumbar spine. Sacroiliac joints and pubic symphysis are intact. IMPRESSION: 1. No displaced fracture or dislocation. If patient is unable to bear weight and persistent hip pain, further evaluation with MR examination would be helpful to rule out occult fracture. 2.  Moderate bilateral hip osteoarthritis. 3.  Advanced degenerate disc disease of the lumbar spine. 4. Severe sigmoid colonic diverticulosis without evidence of acute diverticulitis. 5.  Atherosclerotic disease of abdominal aorta and branch vessels. Electronically Signed   By: Keane Police D.O.   On: 12/20/2021 14:24   DG Femur Min 2 Views Left  Result Date: 12/20/2021 CLINICAL DATA:  Fall. EXAM: LEFT FEMUR 2 VIEWS COMPARISON:  None Available. FINDINGS: There is no evidence of fracture or other focal bone lesions. Mild degenerative changes of the left hip and knee with chondrocalcinosis. Osteopenia. Soft tissues are unremarkable. IMPRESSION: 1. No acute osseous abnormality. Electronically Signed   By: Titus Dubin M.D.   On: 12/20/2021 12:15   CT HEAD WO CONTRAST  Result Date: 12/20/2021 CLINICAL DATA:  Head trauma, moderate-severe; Polytrauma, blunt. EXAM: CT HEAD WITHOUT CONTRAST CT CERVICAL SPINE WITHOUT CONTRAST TECHNIQUE: Multidetector CT imaging of the head and cervical spine was performed following the standard protocol without intravenous contrast. Multiplanar CT image reconstructions of the cervical spine were also generated. RADIATION DOSE REDUCTION: This exam was performed according to the departmental dose-optimization program which includes automated exposure control, adjustment of the mA and/or kV according to patient size and/or use of iterative reconstruction technique. COMPARISON:  Head CT and head and neck CTA 07/25/2020. Head MRI 07/26/2020. Cervical spine CT 02/23/2020. FINDINGS: CT HEAD FINDINGS Brain: There is no evidence of an acute infarct, intracranial hemorrhage, mass, midline shift, or extra-axial fluid collection.  Patchy to confluent hypodensities in  the cerebral white matter bilaterally are unchanged and nonspecific but compatible with moderately extensive chronic small vessel ischemic disease. A small chronic cortical infarct is again noted in the right occipital lobe. There is mild-to-moderate cerebral and severe cerebellar atrophy. Vascular: Calcified atherosclerosis at the skull base. No hyperdense vessel. Skull: No acute fracture or suspicious osseous lesion. Sinuses/Orbits: Mild mucosal thickening in the paranasal sinuses. No significant mastoid fluid. Left cataract extraction. Other: None. CT CERVICAL SPINE FINDINGS The study is mildly to moderately motion degraded, greatest at C1-2. Alignment: No evidence of acute traumatic malalignment. Skull base and vertebrae: No acute fracture or suspicious osseous lesion is identified within limitations of motion artifact. Moderate median C1-2 arthropathy. 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 Soft tissues and spinal canal: No prevertebral fluid or swelling. No visible canal hematoma. Disc levels: Moderately advanced disc degeneration at C3-4, C4-5, and C6-7. Advanced facet arthrosis on the left at C2-3 and on the right at C4-5. Moderate bilateral facet arthrosis at C3-4. Mild multilevel neural foraminal stenosis. No evidence of high-grade spinal canal stenosis. Upper chest: Right greater than left lung apical scarring. Small bilateral pleural effusions. Other: Mild calcific atherosclerosis at the carotid bifurcations. IMPRESSION: 1. No evidence of acute intracranial abnormality. 2. Moderately extensive chronic small vessel ischemic disease. Severe cerebellar atrophy. 3. No acute cervical spine fracture identified within limitations of motion artifact. 4. Small bilateral pleural effusions. Electronically Signed   By: Logan Bores M.D.   On: 12/20/2021 11:06   CT CERVICAL SPINE WO CONTRAST  Result Date: 12/20/2021 CLINICAL DATA:  Head trauma, moderate-severe; Polytrauma, blunt. EXAM: CT  HEAD WITHOUT CONTRAST CT CERVICAL SPINE WITHOUT CONTRAST TECHNIQUE: Multidetector CT imaging of the head and cervical spine was performed following the standard protocol without intravenous contrast. Multiplanar CT image reconstructions of the cervical spine were also generated. RADIATION DOSE REDUCTION: This exam was performed according to the departmental dose-optimization program which includes automated exposure control, adjustment of the mA and/or kV according to patient size and/or use of iterative reconstruction technique. COMPARISON:  Head CT and head and neck CTA 07/25/2020. Head MRI 07/26/2020. Cervical spine CT 02/23/2020. FINDINGS: CT HEAD FINDINGS Brain: There is no evidence of an acute infarct, intracranial hemorrhage, mass, midline shift, or extra-axial fluid collection. Patchy to confluent hypodensities in the cerebral white matter bilaterally are unchanged and nonspecific but compatible with moderately extensive chronic small vessel ischemic disease. A small chronic cortical infarct is again noted in the right occipital lobe. There is mild-to-moderate cerebral and severe cerebellar atrophy. Vascular: Calcified atherosclerosis at the skull base. No hyperdense vessel. Skull: No acute fracture or suspicious osseous lesion. Sinuses/Orbits: Mild mucosal thickening in the paranasal sinuses. No significant mastoid fluid. Left cataract extraction. Other: None. CT CERVICAL SPINE FINDINGS The study is mildly to moderately motion degraded, greatest at C1-2. Alignment: No evidence of acute traumatic malalignment. Skull base and vertebrae: No acute fracture or suspicious osseous lesion is identified within limitations of motion artifact. Moderate median C1-2 arthropathy. 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 Soft tissues and spinal canal: No prevertebral fluid or swelling. No visible canal hematoma. Disc levels: Moderately advanced disc degeneration at C3-4, C4-5, and C6-7. Advanced facet arthrosis on the left at C2-3 and on  the right at C4-5. Moderate bilateral facet arthrosis at C3-4. Mild multilevel neural foraminal stenosis. No evidence of high-grade spinal canal stenosis. Upper chest: Right greater than left lung apical scarring. Small bilateral pleural effusions. Other: Mild calcific atherosclerosis at the carotid bifurcations. IMPRESSION: 1. No evidence of acute intracranial abnormality. 2. Moderately extensive  chronic small vessel ischemic disease. Severe cerebellar atrophy. 3. No acute cervical spine fracture identified within limitations of motion artifact. 4. Small bilateral pleural effusions. Electronically Signed   By: Logan Bores M.D.   On: 12/20/2021 11:06   DG Pelvis Portable  Result Date: 12/20/2021 CLINICAL DATA:  Trauma EXAM: PORTABLE PELVIS 1-2 VIEWS COMPARISON:  CT 09/24/2019 FINDINGS: Suboptimally positioned pelvic radiograph to evaluate the femoral necks. There is a possible subcapital fracture of the left femoral neck. There is moderate left and mild right osteoarthritis of the hips. Degenerative changes of the lumbar spine and SI joints. IMPRESSION: Possible subcapital fracture of the left femoral neck. Recommend dedicated hip radiographs or CT of the pelvis. Electronically Signed   By: Maurine Simmering M.D.   On: 12/20/2021 10:52   DG Chest Port 1 View  Result Date: 12/20/2021 CLINICAL DATA:  Trauma EXAM: PORTABLE CHEST 1 VIEW COMPARISON:  None Available. FINDINGS: Mildly enlarged cardiac silhouette. Aortic arch calcifications. There are diffuse interstitial and lower lung predominant alveolar opacities. There are small bilateral pleural effusions. No pneumothorax. There is a double density appearance of the left midclavicle consistent with chronic midclavicle fracture deformity is seen on prior CT of the cervical spine in November 2021. Bilateral shoulder degenerative changes. Thoracic spondylosis. IMPRESSION: Mild to moderate pulmonary edema with small bilateral pleural effusions and adjacent basilar  atelectasis. Mild cardiomegaly. Chronic left mid clavicle injury. Electronically Signed   By: Maurine Simmering M.D.   On: 12/20/2021 10:46    Cardiac Studies   Echo 12/21/2021  1. Left ventricular ejection fraction, by estimation, is 35 to 40%. The  left ventricle has moderately decreased function. The left ventricle  demonstrates global hypokinesis. There is mild concentric left ventricular  hypertrophy. Left ventricular  diastolic function could not be evaluated.   2. Right ventricular systolic function is moderately reduced. The right  ventricular size is mildly enlarged. There is moderately elevated  pulmonary artery systolic pressure. The estimated right ventricular  systolic pressure is 42.5 mmHg.   3. Left atrial size was severely dilated.   4. Right atrial size was severely dilated.   5. The mitral valve is grossly normal. Trivial mitral valve  regurgitation. No evidence of mitral stenosis.   6. The tricuspid valve is abnormal. Tricuspid valve regurgitation is mild  to moderate.   7. The aortic valve is tricuspid. There is moderate calcification of the  aortic valve. There is moderate thickening of the aortic valve. Aortic  valve regurgitation is not visualized. Mild to moderate aortic valve  stenosis. Aortic valve area, by VTI  measures 1.65 cm. Aortic valve mean gradient measures 17.3 mmHg. Aortic  valve Vmax measures 2.96 m/s.   8. The inferior vena cava is normal in size with <50% respiratory  variability, suggesting right atrial pressure of 8 mmHg.   Patient Profile     88 y.o. male male with a hx of PAF, chronic HFrEF, moderate-severe MR, mild AS, CAD, HLD, CVA, aortic atherosclerosis, CKD stage 3a, macular degeneration who is being seen 12/21/2021 for the evaluation of CHF at the request of Dr. Lurline Hare.  Assessment & Plan    Acute on chronic HFrEF with volume overload also felt contributed by hypoalbuminemia Moderate-severe MR, mild AS Acute hypoxic respiratory  failure - BNP 2065 on admit - After one dose of IV lasix, he became hyperkalemic and hyponatremic. Felt intravascularly depleted in setting og hypoalbuminemia. Held lasix.  - Echo yesterday showed improved LVEF to 35-40% from 20-25%. Moderately reduced RV  function. RSVP 52.1 mmHg. Mild to moderate AS with mean gradient of 17.3 mm Hg.  - Normal K and Na - He became hypoxic after taking off oxygen prior to my evaluation. Nurse is placing back on oxygen. Working with PT/OT - Continue compression stocking   4. Paroxysmal atrial fibrillation/Flutter - Rate controlled. Off metoprolol due to transient bradycardia>> resolved.  - Continue Eliquis 2.'5mg'$  BID (if recurrent fall, needs to discuss risk vs benefits)  5.  Failure to thrive He does not want to eat breakfast.  Nurse barely able to give him medications.  She is going to give him Ensure.  Very somnolent.  Discussed goals of care recommended. For questions or updates, please contact [place] Please consult [email or web address] for contact info under        SignedLeanor Kail, PA  12/22/2021, 9:38 AM

## 2021-12-22 NOTE — Plan of Care (Signed)
  Problem: Clinical Measurements: Goal: Respiratory complications will improve Outcome: Progressing   Problem: Activity: Goal: Risk for activity intolerance will decrease Outcome: Progressing   Problem: Coping: Goal: Level of anxiety will decrease Outcome: Progressing   Problem: Safety: Goal: Ability to remain free from injury will improve Outcome: Progressing   Problem: Activity: Goal: Capacity to carry out activities will improve Outcome: Progressing

## 2021-12-22 NOTE — Progress Notes (Signed)
   12/22/21 1025  Assess: MEWS Score  Temp (!) 97.3 F (36.3 C)  BP 107/77  MAP (mmHg) 83  Pulse Rate 84  ECG Heart Rate 84  Resp (!) 28  Level of Consciousness Alert  SpO2 96 %  O2 Device Nasal Cannula  Patient Activity (if Appropriate) In bed  O2 Flow Rate (L/min) 4 L/min  Assess: MEWS Score  MEWS Temp 0  MEWS Systolic 0  MEWS Pulse 0  MEWS RR 2  MEWS LOC 0  MEWS Score 2  MEWS Score Color Yellow  Assess: if the MEWS score is Yellow or Red  Were vital signs taken at a resting state? Yes  Focused Assessment Change from prior assessment (see assessment flowsheet)  Does the patient meet 2 or more of the SIRS criteria? Yes  Does the patient have a confirmed or suspected source of infection? No  MEWS guidelines implemented *See Row Information* Yes  Treat  MEWS Interventions Administered scheduled meds/treatments;Other (Comment) (Primary team made aware)  Pain Scale 0-10  Pain Score 0  Take Vital Signs  Increase Vital Sign Frequency  Yellow: Q 2hr X 2 then Q 4hr X 2, if remains yellow, continue Q 4hrs  Escalate  MEWS: Escalate Yellow: discuss with charge nurse/RN and consider discussing with provider and RRT  Notify: Charge Nurse/RN  Name of Charge Nurse/RN Notified Beverlee Nims RN  Date Charge Nurse/RN Notified 12/22/21  Time Charge Nurse/RN Notified 84  Notify: Provider  Provider Name/Title Dr. Raquel Sarna Do  Date Provider Notified 12/22/21  Time Provider Notified 1024  Method of Notification Page;Face-to-face  Notification Reason Other (Comment) (desaturation)  Provider response At bedside;See new orders  Date of Provider Response 12/22/21  Time of Provider Response 1026  Notify: Rapid Response  Name of Rapid Response RN Notified Not applicable  Document  Patient Outcome Other (Comment) (pt is stable, not in distress)  Progress note created (see row info) Yes  Assess: SIRS CRITERIA  SIRS Temperature  0  SIRS Pulse 0  SIRS Respirations  1  SIRS WBC 1  SIRS Score Sum   2

## 2021-12-22 NOTE — Progress Notes (Signed)
Primary team at bedside, with orders placed.  Saturation now 96% on 4L [place].  Needs addressed.

## 2021-12-22 NOTE — Assessment & Plan Note (Addendum)
Poor nutritional intake with low albumin on admission likely contributing to his acute on chronic HF.  -added ensure -continue to encourage PO intake

## 2021-12-22 NOTE — Progress Notes (Signed)
Physical Therapy Treatment Patient Details Name: Alejandro Moore MRN: [id number] DOB: Jan 17, 1920 Today's Date: 12/22/2021   History of Present Illness Pt is a 88 y.o. male who presented 12/20/21 after mechanical fall and acute on chronic CHF. CT of head, L femur, pelvis unremarkable.  PMH:  CHF, Afib, HLD, CKD III, HTN, CAD, macular degeneration, and GERD, CVA's    PT Comments    Pt received supine sleeping but easily woken and agreeable to session with slow but steady progress toward acute goals. Pt needing up to min assist for all bed mobility and transfers to stand with RW. Pt able to complete standing therex with min assist to steady and take a few side steps and steps forward/back with seat rest between without LOB, however pt with multiple intermittent instances of knee buckling with pt stating feelings of muscles "just giving out" without warning. Further ambulation away from EOB deferred for pt safety. Plan to progress gait with chair follow next session within pt tolerance. Pt continues to benefit from skilled PT services to progress toward functional mobility goals.    Recommendations for follow up therapy are one component of a multi-disciplinary discharge planning process, led by the attending physician.  Recommendations may be updated based on patient status, additional functional criteria and insurance authorization.  Follow Up Recommendations  Skilled nursing-short term rehab (<3 hours/day) Can patient physically be transported by private vehicle: No   Assistance Recommended at Discharge Frequent or constant Supervision/Assistance  Patient can return home with the following A lot of help with walking and/or transfers;A lot of help with bathing/dressing/bathroom;Assistance with feeding;Assistance with cooking/housework;Direct supervision/assist for medications management;Direct supervision/assist for financial management;Assist for transportation;Help with stairs or ramp for  entrance   Equipment Recommendations  Wheelchair (measurements PT)    Recommendations for Other Services       Precautions / Restrictions Precautions Precautions: Fall Restrictions Weight Bearing Restrictions: No     Mobility  Bed Mobility Overal bed mobility: Needs Assistance Bed Mobility: Supine to Sit, Sit to Supine     Supine to sit: Min assist Sit to supine: Min assist   General bed mobility comments: min A to come to sittin EOB. Pt able to begin to lift LE's into bed, min A needed to complete task and position in bed    Transfers Overall transfer level: Needs assistance Equipment used: Rolling walker (2 wheels) Transfers: Sit to/from Stand Sit to Stand: Min assist           General transfer comment: able to come so standing x3 throughout session, march in place, side step to [facility], and take a few steps forward/back with seated rest between all. Pt with multiple episodes of knee buckling without warning    Ambulation/Gait               General Gait Details: unable   Stairs             Wheelchair Mobility    Modified Rankin (Stroke Patients Only)       Balance Overall balance assessment: Needs assistance Sitting-balance support: Feet supported, No upper extremity supported Sitting balance-Leahy Scale: Fair Sitting balance - Comments: pt maintained kyphotic posture in sitting with face almost in lunch tray. Able to use UE's for short period of time and then had to rest   Standing balance support: Bilateral upper extremity supported Standing balance-Leahy Scale: Poor Standing balance comment: heavy reliance on BUE support  Cognition Arousal/Alertness: Lethargic Behavior During Therapy: WFL for tasks assessed/performed Overall Cognitive Status: No family/caregiver present to determine baseline cognitive functioning                                 General Comments: difficult to assess  due to pt fatigue        Exercises      General Comments General comments (skin integrity, edema, etc.): VSS on 4L      Pertinent Vitals/Pain Pain Assessment Pain Assessment: Faces Faces Pain Scale: Hurts a little bit Pain Location: head Pain Descriptors / Indicators: Sore Pain Intervention(s): Monitored during session, Limited activity within patient's tolerance    Home Living                          Prior Function            PT Goals (current goals can now be found in the care plan section) Acute Rehab PT Goals PT Goal Formulation: With patient Time For Goal Achievement: 01/04/22    Frequency    Min 3X/week      PT Plan      Co-evaluation              AM-PAC PT "6 Clicks" Mobility   Outcome Measure  Help needed turning from your back to your side while in a flat bed without using bedrails?: A Little Help needed moving from lying on your back to sitting on the side of a flat bed without using bedrails?: A Little Help needed moving to and from a bed to a chair (including a wheelchair)?: A Lot Help needed standing up from a chair using your arms (e.g., wheelchair or bedside chair)?: A Lot (for cues and safety) Help needed to walk in hospital room?: Total Help needed climbing 3-5 steps with a railing? : Total 6 Click Score: 12    End of Session Equipment Utilized During Treatment: Oxygen Activity Tolerance: Patient limited by fatigue;Patient tolerated treatment well Patient left: in bed;with call bell/phone within reach;with bed alarm set Nurse Communication: Mobility status PT Visit Diagnosis: Unsteadiness on feet (R26.81);Muscle weakness (generalized) (M62.81);History of falling (Z91.81);Difficulty in walking, not elsewhere classified (R26.2)     Time: 2620-3559 PT Time Calculation (min) (ACUTE ONLY): 20 min  Charges:  $Therapeutic Activity: 8-22 mins                    Della Scrivener R. PTA Acute Rehabilitation Services Office:  [place] 12/22/2021, 2:36 PM

## 2021-12-22 NOTE — Progress Notes (Signed)
FMTS Interim Progress Note  S: Page from RN reporting patient dropping O2 sats to the 70s after trying to wean down O2 requirement. When I arrived to the room patient was sating in the low 80s to high 70s on 4L O2. Patient improved to the low 90s while I was in the room after several deep breaths.   O: BP 107/77 (BP Location: Left Arm)   Pulse 84   Temp 97.8 F (36.6 C) (Oral)   Resp (!) 28   Ht '5\' 5"'$  (1.651 m)   Wt 53.5 kg   SpO2 96%   BMI 19.63 kg/m   Chronically ill, no acute distress Pulm: decreased lung sounds with bilateral base crackles. No increased work of breathing.  Cardio: regular rate, regular rhythm   A/P: Hypoxia:  - patient is DNR/DNI - O2 improvement after taking deep breaths  - cont O2, do not try to wean at this time  - patient will likely go home with O2  - STAT CXR to evaluate for volume overload  - cont gentle diuresis  - Daughter, Amy, updated on status   Darci Current, DO 12/22/2021, 10:32 AM PGY-1, [place] Medicine Service pager [phone]

## 2021-12-22 NOTE — TOC Progression Note (Signed)
Transition of Care [facility]) - Progression Note    Patient Details  Name: Alejandro Moore MRN: [id number] Date of Birth: 1920/01/21  Transition of Care [facility]) CM/SW [place], [place] Phone Number: 12/22/2021, 12:14 PM  Clinical Narrative:     Called pt's Daughter and updated her on PT rec for SNF and that [facility] has offered a bed. Daughter expresses concern about why pt's mobility has declined between getting in the ambulance to come to the hospital and now. CSW explained current IV treatments for CHF but otherwise deferred to medical team to discuss medical status. Daughter indicates that that they would only want SNF if necessary and seems to be interested in bringing pt home depending on his mobility over the course of this admission. TOC will follow to assist with potential DC needs. Daughter plans to come later today to see pt.   Expected Discharge Plan: [place] Barriers to Discharge: Continued Medical Work up, SNF Pending bed offer  Expected Discharge Plan and Services Expected Discharge Plan: [place] arrangements for the past 2 months: Single Family Home                                       Social Determinants of Health (SDOH) Interventions    Readmission Risk Interventions     No data to display

## 2021-12-22 NOTE — Progress Notes (Signed)
Daily Progress Note Intern Pager: [phone]  Patient name: Alejandro Moore Medical record number: [id number] Date of birth: [birth year] Age: 88 y.o. Gender: male  Primary Care Provider: Idelle Crouch, MD Consultants: cardiology Code Status: DNR/DNI  Pt Overview and Major Events to Date:  9/11: Admit FMTS   Assessment and Plan: Amilio Zehnder is a 88 y.o. male presenting after a mechanical fall and with acute on chronic HF.  PMHx: CHF (NYHA Class 3), pA fib, CKD stage 3, HLD  * Acute on chronic combined systolic and diastolic CHF (congestive heart failure) (HCC) Acute on chronic systolic HF with pitting edema. Thought to be multifactorial with contributing protein calorie malnutrition. BNP consistent with HF, repeat echo with LVEF improved to 35-40% however this could also be falsely elevated in the setting of his intravascular volume depletion.. Continues to have pitting edema after one time dose of IV lasix and suspected to be intravascularly volume depleted so recommended holding off further diuresis per cardiology. Troponin trended, elevated and stable but not a candidate for invasive work-up given his age. Metoprolol held given episode of bradycardia overnight, would consider discharge on lower dose of metoprolol (12.5 BID) but will defer to cardiology.   - Daily weights, strict I&O's - Cardiology consulted, appreciate their recs  - Holding off on further diuresis at this time  - encourage PO intake, added ensure  - repeat CMP  - Metoprolol held in setting of bradycardia, pending cards recommendations. - Supplemental O2 to maintain sats >90% and will likely need O2 on discharge. Continue to monitor O2 need.      Fall at home He had an unwitnessed fall at home with no LOC. He has bruising and bleeding on the top of his scalp. Traumatic w/u in the ED including CT head, DG L femur, DG pelvis, CT pelvis unremarkable. Unable to bear weight with PT but continues to deny  pain, able to lift leg on bed. Per family, he was tap dancing prior to admission. On interview today, he is oriented to self, building, and remembers what led to this hospitalization. Still disoriented to time and city. UA negative for nitrites. Suspect mental status change is due to post-concussive syndrome.  - Wound care for scalp  - Continue to monitor mental status  - SLP recommend dysphagia diet  - obtain MRI for occult fx given change from baseline status   Poor nutrition Poor nutritional intake with low albumin on admission likely contributing to his acute on chronic HF.  -added ensure -continue to encourage PO intake  -SLP following, appreciate assistance  Paroxysmal A-fib ([place]) EKG with Afib on admission. Rate controlled at home with Metoprolol  - Continue home Eliquis 2.'5mg'$  BID - Held Metoprolol tartrate '25mg'$  BID in the setting of bradycardia, pending cards recs - Continuous cardiac monitoring  BPH with urinary obstruction Patient with history of BPH and 475 mL of output yesterday. Bladder scan with 15 mL, so less suspicious for urinary retention.  -on tamsulosin  Hyperkalemia-resolved as of 12/22/2021 Resolved after lokelma 10   FEN/GI: dyphagia + ensure PPx: on eliquis  Dispo:SNF pending clinical improvement . Barriers include bed offer.   Subjective:  No acute events overnight. His hyperkalemia has resolved with lokelma. Patient is oriented to self, place, and what led to his hospitalization today. He is denying any pain, including L hip pain. He denies chest pain, SOB.    Objective: Temp:  [97.8 F (36.6 C)-98.6 F (37 C)] 97.8 F (36.6 C) (09/13  0800) Pulse Rate:  [64-85] 84 (09/13 0800) Resp:  [13-20] 18 (09/13 0800) BP: (110-129)/(59-68) 129/66 (09/13 0800) SpO2:  [91 %-95 %] 95 % (09/13 0800) Weight:  [53.5 kg] 53.5 kg (09/13 0450) Physical Exam: General: Emaciated Caucasian male sitting in bed with O2, in no acute distress.  HEENT: No peripheral fields  present due to macular degeneration.  Cardiovascular: Systolic murmur. Regular rate.  Respiratory: CTAB. On 3L [place].  Abdomen: soft, non-tender, non-distended  Extremities: 3+ peripheral edema   Laboratory: Most recent CBC Lab Results  Component Value Date   WBC 6.5 12/22/2021   HGB 11.2 (L) 12/22/2021   HCT 34.6 (L) 12/22/2021   MCV 112.0 (H) 12/22/2021   PLT 103 (L) 12/22/2021   Most recent BMP    Latest Ref Rng & Units 12/22/2021    5:01 AM  BMP  Glucose 70 - 99 mg/dL 118   BUN 8 - 23 mg/dL 32   Creatinine 0.61 - 1.24 mg/dL 1.31   Sodium 135 - 145 mmol/L 143   Potassium 3.5 - 5.1 mmol/L 4.0   Chloride 98 - 111 mmol/L 100   CO2 22 - 32 mmol/L 35   Calcium 8.9 - 10.3 mg/dL 8.5     Rolanda Lundborg, MD 12/22/2021, 8:53 AM  PGY-1, [place] Intern pager: [phone], text pages welcome Secure chat group [place]

## 2021-12-22 NOTE — Progress Notes (Signed)
Patient's saturation 78-81% on 4L [place], patient states he is "slightly short of breath".  Patient is arousable, following commands.  No distress at this time.  Primary team made aware.

## 2021-12-23 DIAGNOSIS — E639 Nutritional deficiency, unspecified: Secondary | ICD-10-CM | POA: Diagnosis not present

## 2021-12-23 DIAGNOSIS — W19XXXD Unspecified fall, subsequent encounter: Secondary | ICD-10-CM | POA: Diagnosis not present

## 2021-12-23 DIAGNOSIS — I480 Paroxysmal atrial fibrillation: Secondary | ICD-10-CM | POA: Diagnosis not present

## 2021-12-23 DIAGNOSIS — I5043 Acute on chronic combined systolic (congestive) and diastolic (congestive) heart failure: Secondary | ICD-10-CM | POA: Diagnosis not present

## 2021-12-23 LAB — BASIC METABOLIC PANEL
Anion gap: 10 (ref 5–15)
BUN: 33 mg/dL — ABNORMAL HIGH (ref 8–23)
CO2: 36 mmol/L — ABNORMAL HIGH (ref 22–32)
Calcium: 8.1 mg/dL — ABNORMAL LOW (ref 8.9–10.3)
Chloride: 97 mmol/L — ABNORMAL LOW (ref 98–111)
Creatinine, Ser: 1.35 mg/dL — ABNORMAL HIGH (ref 0.61–1.24)
GFR, Estimated: 47 mL/min — ABNORMAL LOW (ref >60)
Glucose, Bld: 131 mg/dL — ABNORMAL HIGH (ref 70–99)
Potassium: 4.2 mmol/L (ref 3.5–5.1)
Sodium: 143 mmol/L (ref 135–145)

## 2021-12-23 MED ORDER — ADULT MULTIVITAMIN W/MINERALS CH
1.0000 | ORAL_TABLET | Freq: Every day | ORAL | Status: DC
  Administered 2021-12-23 – 2021-12-24 (×2): 1 via ORAL
  Filled 2021-12-23 (×2): qty 1

## 2021-12-23 MED ORDER — FUROSEMIDE 40 MG PO TABS
40.0000 mg | ORAL_TABLET | Freq: Two times a day (BID) | ORAL | Status: DC
  Administered 2021-12-24 (×2): 40 mg via ORAL
  Filled 2021-12-23 (×2): qty 1

## 2021-12-23 NOTE — Plan of Care (Signed)
  Problem: Clinical Measurements: Goal: Respiratory complications will improve Outcome: Progressing   Problem: Activity: Goal: Risk for activity intolerance will decrease Outcome: Progressing   

## 2021-12-23 NOTE — Progress Notes (Signed)
Speech Language Pathology Treatment: Dysphagia  Patient Details Name: Alejandro Moore MRN: [id number] DOB: [birth year] Today's Date: 12/23/2021 Time: 4818-5631 SLP Time Calculation (min) (ACUTE ONLY): 20 min  Assessment / Plan / Recommendation Clinical Impression  Pt was seen with son-in-law present who reported some trouble with breakfast. He describes this primarily as frequent eructation, but also acknowledges that the pt was not fully upright when he was feeding him because he did not want to disturb nursing staff. SLP and RN both present and reinforced the importance of positioning during intake and encouraged him to always ask for help if needed. He helped SLP reposition pt fully upright, at which point he self-fed thin liquids via straw and bites of graham cracker with no overt signs of aspiration and minimal eructation. Pt and family both are in favor of staying on mechanical soft diet with thin liquids.    HPI HPI: Pt is a 88 yo male presenting s/p GLF with acute on chronic HF. PMH includes: CVA, macular degeneration, cataract, BPH, actinic keratosis      SLP Plan  Continue with current plan of care      Recommendations for follow up therapy are one component of a multi-disciplinary discharge planning process, led by the attending physician.  Recommendations may be updated based on patient status, additional functional criteria and insurance authorization.    Recommendations  Diet recommendations: Dysphagia 3 (mechanical soft);Thin liquid Liquids provided via: Cup;Straw Medication Administration: Whole meds with puree Supervision: Staff to assist with self feeding Compensations: Minimize environmental distractions;Slow rate;Small sips/bites Postural Changes and/or Swallow Maneuvers: Seated upright 90 degrees;Upright 30-60 min after meal                Oral Care Recommendations: Oral care BID Follow Up Recommendations: Skilled nursing-short term rehab (<3  hours/day) Assistance recommended at discharge: Frequent or constant Supervision/Assistance SLP Visit Diagnosis: Dysphagia, unspecified (R13.10) Plan: Continue with current plan of care           Osie Bond., M.A. [place] Office [phone]  Secure chat preferred   12/23/2021, 12:25 PM

## 2021-12-23 NOTE — Progress Notes (Signed)
Rounding Note    Patient Name: Alejandro Moore Date of Encounter: 12/23/2021  [place] Cardiologist: Corey Skains, MD   Subjective   Feels better. Breathing improving.   Inpatient Medications    Scheduled Meds:  apixaban  2.5 mg Oral BID   atorvastatin  40 mg Oral Daily   feeding supplement  237 mL Oral BID BM   furosemide  40 mg Intravenous BID   metoprolol tartrate  12.5 mg Oral BID   tamsulosin  0.4 mg Oral QPC supper   Continuous Infusions:  PRN Meds:    Vital Signs    Vitals:   12/22/21 2103 12/22/21 2321 12/23/21 0302 12/23/21 0715  BP: (!) 131/47 (!) 108/59 124/62 (!) 116/57  Pulse: 98 85 85 85  Resp:  '20 16 18  '$ Temp: 97.8 F (36.6 C) (!) 97.5 F (36.4 C) 97.8 F (36.6 C) 98 F (36.7 C)  TempSrc: Oral Axillary Axillary Oral  SpO2: 90% 95% 94% 98%  Weight:      Height:        Intake/Output Summary (Last 24 hours) at 12/23/2021 1050 Last data filed at 12/23/2021 0750 Gross per 24 hour  Intake 418 ml  Output 2050 ml  Net -1632 ml      12/22/2021    4:50 AM 12/21/2021    5:52 AM 12/20/2021   10:24 AM  Last 3 Weights  Weight (lbs) 117 lb 15.1 oz 123 lb 7.3 oz 127 lb  Weight (kg) 53.5 kg 56 kg 57.607 kg      Telemetry    Atrial flutter at controlled rate  - Personally Reviewed  ECG    N/A  Physical Exam   GEN: Elderly frail ill appearing male in no acute distress.   Neck: No JVD Cardiac: Irregular, no murmurs, rubs, or gallops.  Respiratory: Clear to auscultation bilaterally. GI: Soft, nontender, non-distended  MS: trace edema on compression stocking;  No deformity. Neuro:  Nonfocal  Psych: Normal affect   Labs    High Sensitivity Troponin:   Recent Labs  Lab 12/20/21 2215 12/21/21 0107 12/21/21 0518 12/21/21 1114 12/21/21 1434  TROPONINIHS 159* 176* 192* 208* 205*     Chemistry Recent Labs  Lab 12/20/21 1240 12/21/21 0107 12/21/21 1433 12/22/21 0501 12/23/21 0340  NA 142   < > 144 143 143  K 4.2    < > 4.4 4.0 4.2  CL 103   < > 101 100 97*  CO2 32   < > 32 35* 36*  GLUCOSE 107*   < > 111* 118* 131*  BUN 38*   < > 36* 32* 33*  CREATININE 1.26*   < > 1.35* 1.31* 1.35*  CALCIUM 8.4*   < > 8.4* 8.5* 8.1*  PROT 5.2*  --   --   --   --   ALBUMIN 2.4*  --   --   --   --   AST 33  --   --   --   --   ALT 35  --   --   --   --   ALKPHOS 85  --   --   --   --   BILITOT 1.0  --   --   --   --   GFRNONAA 51*   < > 47* 49* 47*  ANIONGAP 7   < > '11 8 10   '$ < > = values in this interval not displayed.    Lipids No results  for input(s): "CHOL", "TRIG", "HDL", "LABVLDL", "LDLCALC", "CHOLHDL" in the last 168 hours.  Hematology Recent Labs  Lab 12/20/21 1027 12/20/21 1035 12/21/21 0107 12/22/21 0501  WBC 5.3  --  6.2 6.5  RBC 3.67*  --  3.21* 3.09*  HGB 13.2 15.0 11.6* 11.2*  HCT 40.6 44.0 36.1* 34.6*  MCV 110.6*  --  112.5* 112.0*  MCH 36.0*  --  36.1* 36.2*  MCHC 32.5  --  32.1 32.4  RDW 16.9*  --  16.9* 16.5*  PLT 152  --  103* 103*   BNP Recent Labs  Lab 12/21/21 0518  BNP 2,065.1*    Radiology    MR HIP LEFT WO CONTRAST  Result Date: 12/23/2021 CLINICAL DATA:  Hip trauma, left leg pain. EXAM: MR OF THE LEFT HIP WITHOUT CONTRAST TECHNIQUE: Multiplanar, multisequence MR imaging was performed. No intravenous contrast was administered. COMPARISON:  None Available. FINDINGS: Bones: No hip fracture, dislocation or avascular necrosis. No periosteal reaction or bone destruction. No aggressive osseous lesion. Normal sacrum and sacroiliac joints. No SI joint widening or erosive changes. Degenerative disease with disc height loss at L3-4 L4-5. Articular cartilage and labrum Articular cartilage: High-grade partial-thickness cartilage loss of the left femoral head and acetabulum. Partial-thickness cartilage loss of the right femoral head acetabulum. Labrum:  Left labral degeneration. Joint or bursal effusion Joint effusion:  No hip joint effusion.  No SI joint effusion. Bursae:  No bursal  fluid. Muscles and tendons Flexors: Perifascial edema deep to the iliacus muscle bilaterally. Extensors: Normal. Abductors: Normal. Adductors: Edema in the adductor musculature bilaterally which may reflect muscle strain versus mild myositis. No intramuscular fluid collection or hematoma. Gluteals: Mild edema in the gluteus minimus and medius muscles bilaterally which may reflect mild muscle strain versus mild myositis. Hamstrings: Normal. Other findings No pelvic free fluid. No fluid collection or hematoma. No inguinal lymphadenopathy. No inguinal hernia. Generalized anasarca. Mild presacral soft tissue edema. IMPRESSION: 1. No hip fracture, dislocation or avascular necrosis. 2. Moderate osteoarthritis of the left hip. 3. Edema in the adductor musculature, gluteus minimus muscle and gluteus medius muscles bilaterally which may reflect muscle strain versus mild myositis. No intramuscular fluid collection or hematoma. Electronically Signed   By: Kathreen Devoid M.D.   On: 12/23/2021 08:22   DG CHEST PORT 1 VIEW  Result Date: 12/22/2021 CLINICAL DATA:  Hypoxia and desaturations. EXAM: PORTABLE CHEST 1 VIEW COMPARISON:  12/20/2021 FINDINGS: Stable cardiomediastinal contours. Aortic atherosclerotic calcifications. Mild diffuse interstitial edema. Small to moderate and appears similar. Bilateral pleural effusions are again noted. No airspace consolidation. IMPRESSION: Persistent congestive heart failure pattern. Electronically Signed   By: Kerby Moors M.D.   On: 12/22/2021 10:56    Cardiac Studies   Echo 12/21/2021  1. Left ventricular ejection fraction, by estimation, is 35 to 40%. The  left ventricle has moderately decreased function. The left ventricle  demonstrates global hypokinesis. There is mild concentric left ventricular  hypertrophy. Left ventricular  diastolic function could not be evaluated.   2. Right ventricular systolic function is moderately reduced. The right  ventricular size is mildly  enlarged. There is moderately elevated  pulmonary artery systolic pressure. The estimated right ventricular  systolic pressure is 23.5 mmHg.   3. Left atrial size was severely dilated.   4. Right atrial size was severely dilated.   5. The mitral valve is grossly normal. Trivial mitral valve  regurgitation. No evidence of mitral stenosis.   6. The tricuspid valve is abnormal. Tricuspid valve regurgitation is  mild  to moderate.   7. The aortic valve is tricuspid. There is moderate calcification of the  aortic valve. There is moderate thickening of the aortic valve. Aortic  valve regurgitation is not visualized. Mild to moderate aortic valve  stenosis. Aortic valve area, by VTI  measures 1.65 cm. Aortic valve mean gradient measures 17.3 mmHg. Aortic  valve Vmax measures 2.96 m/s.   8. The inferior vena cava is normal in size with <50% respiratory  variability, suggesting right atrial pressure of 8 mmHg.   Patient Profile     88 y.o. male male with a hx of PAF, chronic HFrEF, moderate-severe MR, mild AS, CAD, HLD, CVA, aortic atherosclerosis, CKD stage 3a, macular degeneration who is being seen 12/21/2021 for the evaluation of CHF at the request of Dr. Lurline Hare.  Assessment & Plan    Acute on chronic HFrEF with volume overload also felt contributed by hypoalbuminemia Moderate-severe MR, mild AS Acute hypoxic respiratory failure - BNP 2065 on admit - After one dose of IV lasix, he became hyperkalemic and hyponatremic. Felt intravascularly depleted in setting og hypoalbuminemia. Held lasix. However due to worsening hypoxia and edema started back on IV lasix.  - Diuresed -1.5L yesterday with improved breathing - Continue IV lasix - Encouraged diet intake     4. Paroxysmal atrial fibrillation/Flutter - Rate controlled. Off metoprolol due to transient bradycardia>> resolved.  - Continue Eliquis 2.'5mg'$  BID (if recurrent fall, needs to discuss risk vs benefits)   5.  Failure to  thrive   For questions or updates, please contact [place] Please consult [email or web address] for contact info under        SignedLeanor Kail, PA  12/23/2021, 10:50 AM

## 2021-12-23 NOTE — Progress Notes (Signed)
Occupational Therapy Treatment Patient Details Name: Alejandro Moore MRN: [id number] DOB: [birth year] Today's Date: 12/23/2021   History of present illness Pt is a 88 y.o. male who presented 12/20/21 after mechanical fall and acute on chronic CHF. CT of head, L femur, pelvis unremarkable.  PMH:  CHF, Afib, HLD, CKD III, HTN, CAD, macular degeneration, and GERD, CVA's   OT comments  This 88 yo male making progress today with mobility and ADLs--more limited in this environment due to his vision and unfamiliar place.  He will continue to benefit from acute OT with follow up at SNF.   Recommendations for follow up therapy are one component of a multi-disciplinary discharge planning process, led by the attending physician.  Recommendations may be updated based on patient status, additional functional criteria and insurance authorization.    Follow Up Recommendations  Skilled nursing-short term rehab (<3 hours/day)    Assistance Recommended at Discharge Frequent or constant Supervision/Assistance  Patient can return home with the following  A little help with walking and/or transfers;A little help with bathing/dressing/bathroom;Assistance with feeding;Assistance with cooking/housework;Assist for transportation;Direct supervision/assist for financial management;Direct supervision/assist for medications management   Equipment Recommendations  Other (comment) (TBD next venue)       Precautions / Restrictions Precautions Precautions: Fall Restrictions Weight Bearing Restrictions: No       Mobility Bed Mobility Overal bed mobility: Needs Assistance Bed Mobility: Supine to Sit, Sit to Supine     Supine to sit: Min guard, HOB elevated Sit to supine: Min guard        Transfers Overall transfer level: Needs assistance Equipment used: Rolling walker (2 wheels) Transfers: Sit to/from Stand Sit to Stand: Min assist                 Balance Overall balance assessment: Needs  assistance Sitting-balance support: No upper extremity supported, Feet supported Sitting balance-Leahy Scale: Fair Sitting balance - Comments: pt maintained kyphotic posture in sitting--can sit up tall if cued to do so.   Standing balance support: Bilateral upper extremity supported, Reliant on assistive device for balance Standing balance-Leahy Scale: Poor                             ADL either performed or assessed with clinical judgement   ADL Overall ADL's : Needs assistance/impaired Eating/Feeding: Minimal assistance;Bed level   Grooming: Wash/dry face;Set up;Supervision/safety;Sitting Grooming Details (indicate cue type and reason): in chair             Lower Body Dressing: Minimal assistance;Sit to/from stand Lower Body Dressing Details (indicate cue type and reason): Can cross legs to get to feet to doff and donn socks while seated in chair Toilet Transfer: Minimal assistance;Stand-pivot;Rolling walker (2 wheels) Toilet Transfer Details (indicate cue type and reason): simulated bed>chair beside bed                Extremity/Trunk Assessment Upper Extremity Assessment Upper Extremity Assessment: Generalized weakness            Vision Baseline Vision/History: 6 Macular Degeneration Patient Visual Report: No change from baseline Additional Comments: Pt reports he cannot see well, has trouble knowing what things are and placed in an unfamiliar environment          Cognition Arousal/Alertness: Awake/alert Behavior During Therapy: WFL for tasks assessed/performed Overall Cognitive Status: No family/caregiver present to determine baseline cognitive functioning Area of Impairment: Following commands, Safety/judgement, Awareness, Problem solving  Following Commands: Follows one step commands consistently Safety/Judgement: Decreased awareness of safety Awareness: Emergent Problem Solving: Difficulty sequencing, Requires  verbal cues, Requires tactile cues                General Comments VSS on 4 L    Pertinent Vitals/ Pain       Pain Assessment Pain Assessment: No/denies pain         Frequency  Min 2X/week        Progress Toward Goals  OT Goals(current goals can now be found in the care plan section)  Progress towards OT goals: Progressing toward goals  Acute Rehab OT Goals Patient Stated Goal: did not state OT Goal Formulation: With patient Time For Goal Achievement: 01/04/22 Potential to Achieve Goals: Good  Plan Discharge plan remains appropriate       AM-PAC OT "6 Clicks" Daily Activity     Outcome Measure   Help from another person eating meals?: A Little Help from another person taking care of personal grooming?: A Little Help from another person toileting, which includes using toliet, bedpan, or urinal?: A Lot Help from another person bathing (including washing, rinsing, drying)?: A Little Help from another person to put on and taking off regular upper body clothing?: A Little Help from another person to put on and taking off regular lower body clothing?: A Little 6 Click Score: 17    End of Session Equipment Utilized During Treatment: Oxygen (4 liters)  OT Visit Diagnosis: Unsteadiness on feet (R26.81);Other abnormalities of gait and mobility (R26.89);Muscle weakness (generalized) (M62.81)   Activity Tolerance Patient tolerated treatment well   Patient Left in bed;with call bell/phone within reach;with bed alarm set   Nurse Communication Mobility status (pt needs S with meals due to vision (may need cues))        Time: 1151-1205 OT Time Calculation (min): 14 min  Charges: OT General Charges $OT Visit: 1 Visit OT Treatments $Self Care/Home Management : 8-22 mins  [place], OTR/L Acute Rehab Services [facility] [place] [phone] Office [phone]    Almon Register 12/23/2021, 12:51 PM

## 2021-12-23 NOTE — TOC Progression Note (Signed)
Transition of Care [facility]) - Progression Note    Patient Details  Name: Alejandro Moore MRN: [id number] Date of Birth: [birth year]  Transition of Care [facility]) CM/SW [place], [place] Phone Number: 12/23/2021, 1:15 PM  Clinical Narrative:     CSW called pt's daughter. She confirmed choice of [place] SNF at d/c. CSW explained anticipated DC tomorrow and that CSW will assist with transition on DC.  [facility] confirmed they have bed for pt tomorrow.   Expected Discharge Plan: [place] Barriers to Discharge: Continued Medical Work up  Expected Discharge Plan and Services Expected Discharge Plan: [place] arrangements for the past 2 months: Single Family Home                                       Social Determinants of Health (SDOH) Interventions    Readmission Risk Interventions     No data to display

## 2021-12-23 NOTE — Progress Notes (Signed)
Daily Progress Note Intern Pager: [phone]  Patient name: Alejandro Moore Medical record number: [id number] Date of birth: Sep 30, 1919 Age: 88 y.o. Gender: male  Primary Care Provider: Idelle Crouch, MD Consultants: cardiology Code Status: DNR  Pt Overview and Major Events to Date:  9/11: Admit FMTS   Assessment and Plan: Hannibal Skalla is a 88 y.o. male presenting after a mechanical fall and with acute on chronic HF.   Pertinent PMH/PSH includes CHF (NYHA Class 3), pA fib, CKD stage 3, HLD.  * Acute on chronic combined systolic and diastolic CHF (congestive heart failure) (HCC) Acute on chronic systolic HF with pitting edema. Thought to be multifactorial with contributing protein calorie malnutrition. BNP consistent with HF, repeat echo with LVEF improved to 35-40% however this could also be falsely elevated in the setting of his intravascular volume depletion.. Continues to have pitting edema after one time dose of IV lasix and suspected to be intravascularly volume depleted. Troponin trended, elevated and stable but not a candidate for invasive work-up given his age. Received additional IV lasix 40 BID yesterday per cardiology given episode of desatting with attempt to wean O2. Plan to discharge on lower dose of metoprolol (12.5 BID) and change lasix to PO prior to discharge.   - Daily weights, strict I&O's - Cardiology consulted, appreciate their recs  - encourage PO intake, added ensure  - trending CMP  - Metoprolol 12.5 BID at time of discharge  - Lasix 40 BID at discharge - Supplemental O2 to maintain sats >85% and will likely need O2 on discharge. Continue to monitor O2 need.      Fall at home He had an unwitnessed fall at home with no LOC. He has bruising and bleeding on the top of his scalp. Traumatic w/u in the ED including CT head, DG L femur, DG pelvis, CT pelvis unremarkable. Continuing to progress with PT. No evidence of fx on MRI. Per family, he was tap  dancing prior to admission. Suspect mental status change is due to post-concussive syndrome. Suspect he needs more physical therapy for help with ambulation due to his recent fall.  - Wound care for scalp  - Continue to monitor mental status  - SLP recommend dysphagia diet    Poor nutrition Poor nutritional intake with low albumin on admission likely contributing to his acute on chronic HF.  -added ensure -continue to encourage PO intake   Paroxysmal A-fib ([place]) EKG with Afib on admission. Rate controlled at home with Metoprolol  - Continue home Eliquis 2.'5mg'$  BID - Decreased Metoprolol tartrate 12.'5mg'$  BID in the setting of bradycardia episode - Continuous cardiac monitoring  BPH with urinary obstruction Patient with history of BPH and 475 mL of output yesterday. Bladder scan with 15 mL, so less suspicious for urinary retention.  -on tamsulosin  Hyperkalemia-resolved as of 12/22/2021 Resolved after lokelma 10   FEN/GI: dysphagia + ensure PPx: on eliquis  Dispo:SNF today. Barriers include insurance auth and medical stability.   Subjective:  Episode of hypoxia with attempt to wean O2 yesterday. Patient lying in bed. He is intermittently somnolent but oriented to self, type of building. Not oriented to city, time. Dominica Severin, son-in-law at bedside. Discussed plan with Dominica Severin. Dominica Severin would like patient to go to SNF.   Objective: Temp:  [97.3 F (36.3 C)-98 F (36.7 C)] 98 F (36.7 C) (09/14 0715) Pulse Rate:  [82-98] 85 (09/14 0715) Resp:  [16-32] 18 (09/14 0715) BP: (107-137)/(47-77) 116/57 (09/14 0715) SpO2:  [90 %-98 %]  98 % (09/14 0715) Physical Exam: General: emaciated appearing male in no acute distress, laying up in bed   Cardiovascular: regular rate and rhythm Respiratory: CTAB Abdomen: soft, non-tender, non-distended  Extremities: 3+ peripheral edema   Laboratory: Most recent CBC Lab Results  Component Value Date   WBC 6.5 12/22/2021   HGB 11.2 (L) 12/22/2021   HCT 34.6  (L) 12/22/2021   MCV 112.0 (H) 12/22/2021   PLT 103 (L) 12/22/2021   Most recent BMP    Latest Ref Rng & Units 12/23/2021    3:40 AM  BMP  Glucose 70 - 99 mg/dL 131   BUN 8 - 23 mg/dL 33   Creatinine 0.61 - 1.24 mg/dL 1.35   Sodium 135 - 145 mmol/L 143   Potassium 3.5 - 5.1 mmol/L 4.2   Chloride 98 - 111 mmol/L 97   CO2 22 - 32 mmol/L 36   Calcium 8.9 - 10.3 mg/dL 8.1   Output 2L yesterday with IV lasix 40 BID.   Imaging/Diagnostic Tests: MRI L hip Radiologist Impression:  1. No hip fracture, dislocation or avascular necrosis. 2. Moderate osteoarthritis of the left hip. 3. Edema in the adductor musculature, gluteus minimus muscle and gluteus medius muscles bilaterally which may reflect muscle strain versus mild myositis. No intramuscular fluid collection or hematoma  Rolanda Lundborg, MD 12/23/2021, 9:21 AM  PGY-1, [place] Intern pager: [phone], text pages welcome Secure chat group [place]

## 2021-12-24 DIAGNOSIS — I480 Paroxysmal atrial fibrillation: Secondary | ICD-10-CM | POA: Diagnosis not present

## 2021-12-24 DIAGNOSIS — E639 Nutritional deficiency, unspecified: Secondary | ICD-10-CM | POA: Diagnosis not present

## 2021-12-24 DIAGNOSIS — I5043 Acute on chronic combined systolic (congestive) and diastolic (congestive) heart failure: Secondary | ICD-10-CM | POA: Diagnosis not present

## 2021-12-24 LAB — CBC
HCT: 34 % — ABNORMAL LOW (ref 39.0–52.0)
Hemoglobin: 11.3 g/dL — ABNORMAL LOW (ref 13.0–17.0)
MCH: 36 pg — ABNORMAL HIGH (ref 26.0–34.0)
MCHC: 33.2 g/dL (ref 30.0–36.0)
MCV: 108.3 fL — ABNORMAL HIGH (ref 80.0–100.0)
Platelets: 79 10*3/uL — ABNORMAL LOW (ref 150–400)
RBC: 3.14 MIL/uL — ABNORMAL LOW (ref 4.22–5.81)
RDW: 15.8 % — ABNORMAL HIGH (ref 11.5–15.5)
WBC: 6.4 10*3/uL (ref 4.0–10.5)
nRBC: 0 % (ref 0.0–0.2)

## 2021-12-24 LAB — BASIC METABOLIC PANEL
Anion gap: 7 (ref 5–15)
BUN: 29 mg/dL — ABNORMAL HIGH (ref 8–23)
CO2: 41 mmol/L — ABNORMAL HIGH (ref 22–32)
Calcium: 8.3 mg/dL — ABNORMAL LOW (ref 8.9–10.3)
Chloride: 96 mmol/L — ABNORMAL LOW (ref 98–111)
Creatinine, Ser: 0.97 mg/dL (ref 0.61–1.24)
GFR, Estimated: >60 mL/min (ref >60)
Glucose, Bld: 109 mg/dL — ABNORMAL HIGH (ref 70–99)
Potassium: 3.9 mmol/L (ref 3.5–5.1)
Sodium: 144 mmol/L (ref 135–145)

## 2021-12-24 MED ORDER — FUROSEMIDE 40 MG PO TABS: 40.0000 mg | ORAL_TABLET | Freq: Two times a day (BID) | ORAL | 0 refills | Status: DC

## 2021-12-24 MED ORDER — METOPROLOL SUCCINATE ER 25 MG PO TB24: 25.0000 mg | ORAL_TABLET | Freq: Every day | ORAL | Status: DC

## 2021-12-24 MED ORDER — METOPROLOL SUCCINATE ER 25 MG PO TB24: 25.0000 mg | ORAL_TABLET | Freq: Every day | ORAL | 0 refills | Status: DC

## 2021-12-24 NOTE — Discharge Summary (Addendum)
[facility] Discharge Summary  Patient name: Alejandro Moore Medical record number: [id number] Date of birth: Dec 20, 1919 Age: 88 y.o. Gender: male Date of Admission: 12/20/2021 Date of Discharge: 12/24/21 Admitting Physician: Erskine Emery, MD  Primary Care Provider: Idelle Crouch, MD Consultants: cardiology  Indication for Hospitalization: Acute on chronic systolic HF  Brief Hospital Course:  Alejandro Moore is a 88 y.o. male presenting after a mechanical fall and with acute on chronic HF.  PMHx: CHF (NYHA Class 3), pA fib, CKD stage 3, HLD   CHF (congestive heart failure), NYHA class III, acute on chronic, systolic (HCC) Moderate to severe MR  Acute on chronic systolic HF with pitting edema. Cardiology was consulted who felt his fluid overload was likely multifactorial and contributed to by low albumin with possible protein-calorie malnutrition. Per family, he has been compliant with his medications and has not had progressive dyspnea or any reported chest pain. Repeat echo with slightly improved EF but this was thought to be falsely elevated due to his depleted intravascular volume. He received one time dose of IV lasix in the ED with subsequent hyperkalemia and hyponatremia so we continued with gentle diuresis during admission. He did not tolerate weaning O2 initially, had an episode of hypoxia with stable CXR. Additional IV lasix was administered. He was transitioned from IV to PO lasix. Cardiology also recommended continuing with fluid supplements to increase albumin. He was able to ambulate with PT without O2 at time of discharge.  Elevated troponins  He had elevated but stable troponins during admission without any angina, likely related to demand ischemia. Cardiology did not recommend invasive work-up given his age.   Fall at home Altered mental status  He had an unwitnessed fall at home with no LOC. He has bruising and bleeding on the top of  his scalp. Traumatic w/u in the ED including CT head, DG L femur, DG pelvis, CT pelvis unremarkable. On admission, he is only oriented to self. Suspect his altered mental status in the setting of post-concussive syndrome. Patient unable to bear weight with PT and at baseline was tap dancing at home. MRI of L hip was obtained and did not show evidence of occult fracture and patient was able to ambulate with PT prior to discharge.   Afib He was restarted on his home eliquis and this was continued per cardiology. He was also restarted on his home metoprolol but this was held in the setting of bradycardia. At discharge, cardiology transitioned him to metoprolol succinate.   Poor nutrition He had poor PO intake during admission and ensure was added to supplement his diet.     Chronic conditions:  HLD: restarted home lipitor 40  BPH: restarted home tamsulosin 0.4 Afib: restarted home eliquis   Items for Follow Up: Macrocytosis inpatient, check folate/B12 outpatient. Recommend starting multivitamin Monitor volume status as on twice daily lasix Check BMP to monitor creatinine in 3 days  Discharge Diagnoses/Problem List:  Principal Problem:   Acute on chronic combined systolic and diastolic CHF (congestive heart failure) (HCC) Active Problems:   Fall   BPH with urinary obstruction   Paroxysmal A-fib (HCC)   CHF (congestive heart failure) (HCC)   Poor nutrition  Disposition: SNF  Discharge Condition: stable  Discharge Exam:  General: Older Caucasian male sitting up in bed in no acute distress Cardiovascular: RRR. Systolic murmur.  Respiratory: Breathing on 4L [place].  Abdomen: Soft, non-tender, non-distended Extremities: +2 peripheral edema  Neuro: alert and oriented to self  and type of building. Not oriented to time or city.   Significant Procedures: none  Significant Labs and Imaging:  Recent Labs  Lab 12/24/21 0415  WBC 6.4  HGB 11.3*  HCT 34.0*  PLT 79*   Recent Labs  Lab  12/23/21 0340 12/24/21 0415  NA 143 144  K 4.2 3.9  CL 97* 96*  CO2 36* 41*  GLUCOSE 131* 109*  BUN 33* 29*  CREATININE 1.35* 0.97  CALCIUM 8.1* 8.3*    Results/Tests Pending at Time of Discharge: None  Discharge Medications:  Allergies as of 12/24/2021   No Known Allergies      Medication List     STOP taking these medications    metoprolol tartrate 25 MG tablet Commonly known as: LOPRESSOR   potassium chloride 10 MEQ tablet Commonly known as: KLOR-CON       TAKE these medications    apixaban 2.5 MG Tabs tablet Commonly known as: ELIQUIS Take 1 tablet (2.5 mg total) by mouth 2 (two) times daily.   atorvastatin 40 MG tablet Commonly known as: Lipitor Take 1 tablet (40 mg total) by mouth daily.   cyanocobalamin 1000 MCG tablet Take 1 tablet (1,000 mcg total) by mouth daily.   esomeprazole 20 MG capsule Commonly known as: NEXIUM Take 20 mg by mouth every other day.   fluticasone 50 MCG/ACT nasal spray Commonly known as: FLONASE Place 2 sprays into both nostrils daily as needed for allergies or rhinitis.   furosemide 40 MG tablet Commonly known as: LASIX Take 1 tablet (40 mg total) by mouth 2 (two) times daily. What changed:  medication strength how much to take when to take this   metoprolol succinate 25 MG 24 hr tablet Commonly known as: TOPROL-XL Take 1 tablet (25 mg total) by mouth daily.   OVER THE COUNTER MEDICATION Take 1 packet by mouth every morning. Multivitamin pak - [employer]   oxymetazoline 0.05 % nasal spray Commonly known as: AFRIN Place 1 spray into both nostrils 2 (two) times daily as needed (nose bleeds).   PreserVision AREDS 2 Caps Take 1 capsule by mouth 2 (two) times daily.   tamsulosin 0.4 MG Caps capsule Commonly known as: FLOMAX Take 0.4 mg by mouth daily after supper.               Discharge Care Instructions  (From admission, onward)           Start     Ordered   12/24/21 0000   Discharge wound care:       Comments: Per orders   12/24/21 1219           Discharge Instructions: Please refer to Patient Instructions section of EMR for full details.  Patient was counseled important signs and symptoms that should prompt return to medical care, changes in medications, dietary instructions, activity restrictions, and follow up appointments.   Follow-Up Appointments:  Follow-up Information     Rise Mu, PA-C Follow up.   Specialties: Physician Assistant, Cardiology, Radiology Why: [facility] - [facility] location - a cardiology follow-up visit has been arranged for you on Friday Dec 31, 2021 at 8:25 AM (Arrive by 8:10 AM). Contact information: [place] [address] [phone]                PCP  Rolanda Lundborg, MD 12/24/2021, 12:20 PM PGY-1, [place] Upper-Level Resident Addendum   I have  independently interviewed and examined the patient. I have discussed the above with the original author and agree with their documentation. My edits for correction/addition/clarification are in within the document. Please see also any attending notes.   Rise Patience, DO  PGY-3, [facility] 12/24/2021 12:22 PM  [place] Service pager: [phone] (text pages welcome through [facility])

## 2021-12-24 NOTE — Progress Notes (Signed)
Physical Therapy Treatment Patient Details Name: Alejandro Moore MRN: [id number] DOB: 1920-03-23 Today's Date: 12/24/2021   History of Present Illness Pt is a 88 y.o. male who presented 12/20/21 after mechanical fall and acute on chronic CHF. CT of head, L femur, pelvis unremarkable.  PMH:  CHF, Afib, HLD, CKD III, HTN, CAD, macular degeneration, and GERD, CVA's    PT Comments    Pt received supine and agreeable to session with good progress towards acute goals. Session focused on continued transfer training and gait with RW for increased activity tolerance, with pt mobilizing with grossly min assist throughout. Pt needing mod assist to correct LOB x1 when running into objects secondary to visual deficits. VSS on RA throughout (see saturation qualifications note). Pt continues to benefit from skilled PT services to progress toward functional mobility goals.    Recommendations for follow up therapy are one component of a multi-disciplinary discharge planning process, led by the attending physician.  Recommendations may be updated based on patient status, additional functional criteria and insurance authorization.  Follow Up Recommendations  Skilled nursing-short term rehab (<3 hours/day) Can patient physically be transported by private vehicle: No   Assistance Recommended at Discharge Frequent or constant Supervision/Assistance  Patient can return home with the following A lot of help with walking and/or transfers;A lot of help with bathing/dressing/bathroom;Assistance with feeding;Assistance with cooking/housework;Direct supervision/assist for medications management;Direct supervision/assist for financial management;Assist for transportation;Help with stairs or ramp for entrance   Equipment Recommendations  Wheelchair (measurements PT)    Recommendations for Other Services       Precautions / Restrictions Precautions Precautions: Fall Restrictions Weight Bearing Restrictions: No      Mobility  Bed Mobility Overal bed mobility: Needs Assistance Bed Mobility: Supine to Sit, Sit to Supine     Supine to sit: HOB elevated, Supervision Sit to supine: Min guard   General bed mobility comments: increased time and use of bed rail, no physical assist needed    Transfers Overall transfer level: Needs assistance Equipment used: Rolling walker (2 wheels) Transfers: Sit to/from Stand Sit to Stand: Min assist           General transfer comment: light min assist to come to stand from EOB at lowest height    Ambulation/Gait Ambulation/Gait assistance: Min guard, Mod assist (mod assist for LOB) Gait Distance (Feet): 30 Feet Assistive device: Rolling walker (2 wheels) Gait Pattern/deviations: Step-through pattern, Decreased stride length, Drifts right/left, Trunk flexed Gait velocity: decr     General Gait Details: slow, mildy unsteady gait with RW, pt with x1 LOB needing mod asssit to correct as pt running into chair leg in room, secondary to visual deficits. no LOB in clear areas   Stairs             Wheelchair Mobility    Modified Rankin (Stroke Patients Only)       Balance Overall balance assessment: Needs assistance Sitting-balance support: No upper extremity supported, Feet supported Sitting balance-Leahy Scale: Fair Sitting balance - Comments: pt maintained kyphotic posture in sitting--can sit up tall if cued to do so.   Standing balance support: Bilateral upper extremity supported, Reliant on assistive device for balance Standing balance-Leahy Scale: Poor Standing balance comment: heavy reliance on BUE support                            Cognition Arousal/Alertness: Awake/alert Behavior During Therapy: WFL for tasks assessed/performed Overall Cognitive Status: Within Functional Limits for  tasks assessed                                          Exercises Other Exercises Other Exercises: standing marchin x20  reps, x2 sets    General Comments General comments (skin integrity, edema, etc.): VSS on RA      Pertinent Vitals/Pain Pain Assessment Pain Assessment: No/denies pain    Home Living                          Prior Function            PT Goals (current goals can now be found in the care plan section) Acute Rehab PT Goals Patient Stated Goal: return home PT Goal Formulation: With patient Time For Goal Achievement: 01/04/22    Frequency    Min 3X/week      PT Plan      Co-evaluation              AM-PAC PT "6 Clicks" Mobility   Outcome Measure  Help needed turning from your back to your side while in a flat bed without using bedrails?: A Little Help needed moving from lying on your back to sitting on the side of a flat bed without using bedrails?: A Little Help needed moving to and from a bed to a chair (including a wheelchair)?: A Little Help needed standing up from a chair using your arms (e.g., wheelchair or bedside chair)?: A Little Help needed to walk in hospital room?: A Lot (for navigation) Help needed climbing 3-5 steps with a railing? : Total 6 Click Score: 15    End of Session Equipment Utilized During Treatment: Gait belt Activity Tolerance: Patient tolerated treatment well Patient left: in bed;with call bell/phone within reach;with bed alarm set Nurse Communication: Mobility status PT Visit Diagnosis: Unsteadiness on feet (R26.81);Muscle weakness (generalized) (M62.81);History of falling (Z91.81);Difficulty in walking, not elsewhere classified (R26.2)     Time: 4081-4481 PT Time Calculation (min) (ACUTE ONLY): 22 min  Charges:  $Gait Training: 8-22 mins                     Alejandro Moore R. PTA Acute Rehabilitation Services Office: [place] 12/24/2021, 12:27 PM

## 2021-12-24 NOTE — Care Management Important Message (Signed)
Important Message  Patient Details  Name: Alejandro Moore MRN: [id number] Date of Birth: Oct 02, 1919   Medicare Important Message Given:  Yes     Shelda Altes 12/24/2021, 2:20 PM

## 2021-12-24 NOTE — Progress Notes (Signed)
Report called to [facility]. Awaiting transport to arrive for patient.

## 2021-12-24 NOTE — Progress Notes (Signed)
SATURATION QUALIFICATIONS: (This note is used to comply with regulatory documentation for home oxygen)  Patient Saturations on Room Air at Rest = 95%  Patient Saturations on Room Air while Ambulating = 94%   Please briefly explain why patient needs home oxygen: Pt maintaining safe saturation level on room air with mobility. No supplemental oxygen needs this date.   Audry Riles. PTA Acute Rehabilitation Services Office: [phone]

## 2021-12-24 NOTE — Progress Notes (Signed)
Dr. Oval Linsey requested f/u appt - spoke with pt's daughter to clarify since prior pt of Dr. Nehemiah Massed - they want to switch to [facility], [place] location - have arranged f/u and put on AVS. Can get BMET @ that visit. Primary [facility] cardiologist is Dr. Oval Linsey for now but will eventually need to establish with [facility] doc (dtr aware there is a different set of MDs there aside from Dr. Oval Linsey).

## 2021-12-24 NOTE — Progress Notes (Signed)
O2 sat sits at mid to high 80s, pt denies SOB or difficulty breathing. Pt's family is by bedside feeding pt and says this is normal for him to be low while he is eating.

## 2021-12-24 NOTE — TOC CAGE-AID Note (Signed)
Transition of Care [facility]) - CAGE-AID Screening   Patient Details  Name: Alejandro Moore MRN: [id number] Date of Birth: 09/28/1919  Transition of [facility]) CM/SW Contact:    Clovis Cao, RN Phone Number: 12/24/2021, 8:25 PM   Clinical Narrative: Pt here after sustaining a fall.  Pt does not drink alcohol and does not participate in recreational drugs.  No resources needed.  Screening complete.   CAGE-AID Screening:    Have You Ever Felt You Ought to Cut Down on Your Drinking or Drug Use?: No Have People Annoyed You By Critizing Your Drinking Or Drug Use?: No Have You Felt Bad Or Guilty About Your Drinking Or Drug Use?: No Have You Ever Had a Drink or Used Drugs First Thing In The Morning to Steady Your Nerves or to Get Rid of a Hangover?: No CAGE-AID Score: 0  Substance Abuse Education Offered: No

## 2021-12-24 NOTE — TOC Transition Note (Signed)
Transition of Care [facility]) - CM/SW Discharge Note   Patient Details  Name: Alejandro Moore MRN: [id number] Date of Birth: Oct 05, 1919  [facility] of [facility]) CM/SW Contact:  Bethann Berkshire, [place] Phone Number: 12/24/2021, 2:52 PM   Clinical Narrative:     Patient will DC to: [name] Anticipated DC date: 12/24/21 Family notified: Oran Rein (Daughter)  [phone] (Mobile) Transport by: Corey Harold   Per MD patient ready for DC to [facility]. RN, patient, patient's family, and facility notified of DC. Discharge Summary and FL2 sent to facility. RN to call report prior to discharge [phone]). DC packet on chart. Ambulance transport scheduled for 630pm pickup.   CSW will sign off for now as social work intervention is no longer needed. Please consult [place] again if new needs arise.    Final next level of care: Skilled Nursing Facility Barriers to Discharge: No Barriers Identified   Patient Goals and CMS Choice        Discharge Placement              Patient chooses bed at: [name] Patient to be transferred to facility by: [place] Name of family member notified: Oran Rein (Daughter)   [phone] (Mobile) Patient and family notified of of transfer: 12/24/21  Discharge Plan and Services                                     Social Determinants of Health (SDOH) Interventions     Readmission Risk Interventions     No data to display

## 2021-12-24 NOTE — Progress Notes (Signed)
Attempted to wean pt on RA, sustained for 25mn and then had to bump him back up on 1L/min.

## 2021-12-24 NOTE — Progress Notes (Signed)
Attempted to call report twice to the facility but no answer. Will inform night shift nurse to call facility and give report.

## 2021-12-24 NOTE — Discharge Instructions (Signed)
Nursing Education Pt is alert x 3 to 4 when awake. Denies Pain. Legally blind due to macular degeneration but can feed himself once set up. Ambulates with assistance. Educated family and patient on diet and importance to sit upright in bed when eating.

## 2021-12-24 NOTE — Progress Notes (Signed)
Rounding Note    Patient Name: Datron Brakebill Date of Encounter: 12/24/2021  [place] Cardiologist: Corey Skains, MD   Subjective   Feeling well.  Denies CP/SOB.   Inpatient Medications    Scheduled Meds:  apixaban  2.5 mg Oral BID   atorvastatin  40 mg Oral Daily   feeding supplement  237 mL Oral BID BM   furosemide  40 mg Oral BID   metoprolol tartrate  12.5 mg Oral BID   multivitamin with minerals  1 tablet Oral Daily   tamsulosin  0.4 mg Oral QPC supper   Continuous Infusions:  PRN Meds:    Vital Signs    Vitals:   12/24/21 0400 12/24/21 0448 12/24/21 0724 12/24/21 0920  BP: (!) 113/56  125/61 130/65  Pulse: 70  85 88  Resp: 17  11   Temp: 98 F (36.7 C)  (!) 97.2 F (36.2 C)   TempSrc: Oral  Oral   SpO2: 100%  98%   Weight:  52.8 kg    Height:        Intake/Output Summary (Last 24 hours) at 12/24/2021 1057 Last data filed at 12/24/2021 0022 Gross per 24 hour  Intake 358 ml  Output 1250 ml  Net -892 ml      12/24/2021    4:48 AM 12/22/2021    4:50 AM 12/21/2021    5:52 AM  Last 3 Weights  Weight (lbs) 116 lb 6.5 oz 117 lb 15.1 oz 123 lb 7.3 oz  Weight (kg) 52.8 kg 53.5 kg 56 kg      Telemetry    Atrial flutter. Rate <100 bpm.  PVCs.  - Personally Reviewed  ECG    N/a - Personally Reviewed  Physical Exam   VS:  BP 130/65   Pulse 88   Temp (!) 97.2 F (36.2 C) (Oral)   Resp 11   Ht '5\' 5"'$  (1.651 m)   Wt 52.8 kg   SpO2 98%   BMI 19.37 kg/m  , BMI Body mass index is 19.37 kg/m. GENERAL:  Frail but well appearing HEENT: Pupils equal round and reactive, fundi not visualized, oral mucosa unremarkable.  Head laceration with dried blood NECK:  No jugular venous distention, waveform within normal limits, carotid upstroke brisk and symmetric, no bruits, no thyromegaly LUNGS:  Clear to auscultation bilaterally HEART: Irregularly irregular.  PMI not displaced or sustained,S1 and S2 within normal limits, no S3, no S4, no  clicks, no rubs, no murmurs ABD:  Flat, positive bowel sounds normal in frequency in pitch, no bruits, no rebound, no guarding, no midline pulsatile mass, no hepatomegaly, no splenomegaly EXT:  2 plus pulses throughout, trace ankle edema, no cyanosis no clubbing SKIN:  No rashes no nodules NEURO:  Cranial nerves II through XII grossly intact, motor grossly intact throughout PSYCH:  Cognitively intact, oriented to person place and time   Labs    High Sensitivity Troponin:   Recent Labs  Lab 12/20/21 2215 12/21/21 0107 12/21/21 0518 12/21/21 1114 12/21/21 1434  TROPONINIHS 159* 176* 192* 208* 205*     Chemistry Recent Labs  Lab 12/20/21 1240 12/21/21 0107 12/22/21 0501 12/23/21 0340 12/24/21 0415  NA 142   < > 143 143 144  K 4.2   < > 4.0 4.2 3.9  CL 103   < > 100 97* 96*  CO2 32   < > 35* 36* 41*  GLUCOSE 107*   < > 118* 131* 109*  BUN 38*   < >  32* 33* 29*  CREATININE 1.26*   < > 1.31* 1.35* 0.97  CALCIUM 8.4*   < > 8.5* 8.1* 8.3*  PROT 5.2*  --   --   --   --   ALBUMIN 2.4*  --   --   --   --   AST 33  --   --   --   --   ALT 35  --   --   --   --   ALKPHOS 85  --   --   --   --   BILITOT 1.0  --   --   --   --   GFRNONAA 51*   < > 49* 47* >60  ANIONGAP 7   < > '8 10 7   '$ < > = values in this interval not displayed.    Lipids No results for input(s): "CHOL", "TRIG", "HDL", "LABVLDL", "LDLCALC", "CHOLHDL" in the last 168 hours.  Hematology Recent Labs  Lab 12/21/21 0107 12/22/21 0501 12/24/21 0415  WBC 6.2 6.5 6.4  RBC 3.21* 3.09* 3.14*  HGB 11.6* 11.2* 11.3*  HCT 36.1* 34.6* 34.0*  MCV 112.5* 112.0* 108.3*  MCH 36.1* 36.2* 36.0*  MCHC 32.1 32.4 33.2  RDW 16.9* 16.5* 15.8*  PLT 103* 103* 79*   Thyroid No results for input(s): "TSH", "FREET4" in the last 168 hours.  BNP Recent Labs  Lab 12/21/21 0518  BNP 2,065.1*    DDimer No results for input(s): "DDIMER" in the last 168 hours.   Radiology    MR HIP LEFT WO CONTRAST  Result Date:  12/23/2021 CLINICAL DATA:  Hip trauma, left leg pain. EXAM: MR OF THE LEFT HIP WITHOUT CONTRAST TECHNIQUE: Multiplanar, multisequence MR imaging was performed. No intravenous contrast was administered. COMPARISON:  None Available. FINDINGS: Bones: No hip fracture, dislocation or avascular necrosis. No periosteal reaction or bone destruction. No aggressive osseous lesion. Normal sacrum and sacroiliac joints. No SI joint widening or erosive changes. Degenerative disease with disc height loss at L3-4 L4-5. Articular cartilage and labrum Articular cartilage: High-grade partial-thickness cartilage loss of the left femoral head and acetabulum. Partial-thickness cartilage loss of the right femoral head acetabulum. Labrum:  Left labral degeneration. Joint or bursal effusion Joint effusion:  No hip joint effusion.  No SI joint effusion. Bursae:  No bursal fluid. Muscles and tendons Flexors: Perifascial edema deep to the iliacus muscle bilaterally. Extensors: Normal. Abductors: Normal. Adductors: Edema in the adductor musculature bilaterally which may reflect muscle strain versus mild myositis. No intramuscular fluid collection or hematoma. Gluteals: Mild edema in the gluteus minimus and medius muscles bilaterally which may reflect mild muscle strain versus mild myositis. Hamstrings: Normal. Other findings No pelvic free fluid. No fluid collection or hematoma. No inguinal lymphadenopathy. No inguinal hernia. Generalized anasarca. Mild presacral soft tissue edema. IMPRESSION: 1. No hip fracture, dislocation or avascular necrosis. 2. Moderate osteoarthritis of the left hip. 3. Edema in the adductor musculature, gluteus minimus muscle and gluteus medius muscles bilaterally which may reflect muscle strain versus mild myositis. No intramuscular fluid collection or hematoma. Electronically Signed   By: Kathreen Devoid M.D.   On: 12/23/2021 08:22    Cardiac Studies   Echo 12/21/21:  1. Left ventricular ejection fraction, by  estimation, is 35 to 40%. The  left ventricle has moderately decreased function. The left ventricle  demonstrates global hypokinesis. There is mild concentric left ventricular  hypertrophy. Left ventricular  diastolic function could not be evaluated.   2. Right ventricular systolic  function is moderately reduced. The right  ventricular size is mildly enlarged. There is moderately elevated  pulmonary artery systolic pressure. The estimated right ventricular  systolic pressure is 89.3 mmHg.   3. Left atrial size was severely dilated.   4. Right atrial size was severely dilated.   5. The mitral valve is grossly normal. Trivial mitral valve  regurgitation. No evidence of mitral stenosis.   6. The tricuspid valve is abnormal. Tricuspid valve regurgitation is mild  to moderate.   7. The aortic valve is tricuspid. There is moderate calcification of the  aortic valve. There is moderate thickening of the aortic valve. Aortic  valve regurgitation is not visualized. Mild to moderate aortic valve  stenosis. Aortic valve area, by VTI  measures 1.65 cm. Aortic valve mean gradient measures 17.3 mmHg. Aortic  valve Vmax measures 2.96 m/s.   8. The inferior vena cava is normal in size with <50% respiratory  variability, suggesting right atrial pressure of 8 mmHg.   Patient Profile     Mr. Vantine is a 89M with chronic systolic and diastolic heart failure, moderate to severe mitral regurgitation, mild aortic stenosis, CAD, stroke, aortic atherosclerosis, CKD 3, PAF admitted with volume overload and a fall.    Assessment & Plan    # Acute on chronic diastolic heart failure:  # Moderate to severe MR:  BNP was 2065 on admission.  Renal function initially worsened with attempts at diuresis.  Lasix was held and he developed worsening hypoxic respiratory failure.    He was rechallenged with IV Lasix and diuresed well.  Yesterday was negative almost 900 mL after transitioning to oral Lasix.  Today renal  function is back in the normal range.  Recommend continuing with Lasix 40 mg p.o. twice daily.  Recommend checking a BMP.  Continue with nutritional supplements to increase albumin.  Not a candidate for valve repair given his age.  Continue with medical management.  Will transition metoprolol to succinate.   #Atrial fibrillation/flutter: Currently in rate controlled atrial fibrillation/flutter.  Continue Eliquis and metoprolol.  Metoprolol dose was reduced due to bradycardia which has now been stable.  He did present with a fall but in general has been very stable.  Recommend continuing Eliquis at this time.  # Elevated troponin: # Coronary calcification:  He denies any angina.  He is very active at baseline.  Family showed a video of him tap dancing last week and hitting golf balls.  Not a candidate for an invasive work-up given age.   [place] will sign off.   Medication Recommendations: Transition metoprolol to succinate Other recommendations (labs, testing, etc): BMP in 1 week Follow up as an outpatient: We will arrange  For questions or updates, please contact [place] Please consult [email or web address] for contact info under        Signed, Skeet Latch, MD  12/24/2021, 10:57 AM

## 2021-12-29 NOTE — Progress Notes (Signed)
Cardiology Office Note    Date:  12/31/2021   ID:  Alejandro Moore, DOB Feb 09, 1920, MRN [id number]  PCP:  Idelle Crouch, MD  Cardiologist:  Skeet Latch, MD  Electrophysiologist:  None   Chief Complaint: Hospital follow-up  History of Present Illness:   Alejandro Moore is a 88 y.o. male with history of CAD, HFrEF, PAF/flutter, CVA, mitral regurgitation, aortic stenosis, aortic atherosclerosis, HLD, macrocytic anemia, and macular degeneration who presents for hospital follow-up as outlined below.  He was previously followed by Dr. Nehemiah Massed, though has requested to transition his care to [facility] cardiology following his recent admission to [facility].  He was seen in the ED in 2021 for shortness of breath and found to have clinical heart failure with bedside echo demonstrating an EF of 40%.  He declined admission.  He followed up with the [facility] CHF clinic there after and was found to be in A-fib.  In follow-up with Dr. Nehemiah Massed there after he was back in sinus rhythm.  Echo in 09/2019 showed an EF of 30%, mild LVH, severe mitral regurgitation, moderate tricuspid regurgitation, and mild aortic stenosis.  Prior outside office cardiology notes have stated "The patient has coronary artery disease previously diagnosed by imaging study years ago." CT abdomen/pelvis in 2021 showed coronary atherosclerosis of the LCx and RCA.  He has not had a prior stress test or cath.  He has been managed medically.  Echo from 07/2020 demonstrated an EF of 20 to 25%, global hypokinesis, mild concentric LVH, indeterminate LV diastolic function parameters, moderately reduced RV systolic function with normal ventricular cavity size, severely dilated left atrium, moderately dilated right atrium, moderate to severe mitral regurgitation, moderate to severe tricuspid regurgitation, calcified aortic valve with at least mild stenosis (unable to exclude low-flow low gradient component), and an estimated right  atrial pressure of 3 mmHg.  He was admitted to [facility] from 9/11 through 12/24/2021 after sustaining a fall at home without LOC and AMS complicated by acute on chronic HFrEF, AKI, and elevated troponin.  BNP 2065.  High-sensitivity troponin peaked at 208.  Chest x-ray with mild to moderate pulmonary edema with small bilateral pleural effusions and chronic clavicular injury.  Pelvic plain film raise a question of left femoral neck fracture.  CT of the pelvis showed no displaced fracture or dislocation.  CT head/C-spine showed no acute intracranial abnormality with moderate extensive small vessel disease and severe cerebellar atrophy.  Echo during the admission demonstrated an EF of 35 to 40%, global hypokinesis, mild concentric LVH, indeterminate LV diastolic function parameters, moderately reduced RV systolic function with mildly enlarged ventricular cavity size, moderately elevated PASP estimated at 52.1 mmHg, severe biatrial enlargement, trivial mitral regurgitation, mild to moderate tricuspid regurgitation, tricuspid aortic valve with mild to moderate stenosis with a mean gradient of 17.3 mmHg and a valve area of 1.65 cm, and an estimated right atrial pressure of 8 mmHg.  Initially, he was diuresed with noted AKI, however with worsening hypoxic respiratory failure he was rechallenged with IV Lasix with good diuresis and improvement in dyspnea.  He was not felt to be a candidate for invasive work-up with regards to his elevated troponin or valvular heart disease.  He comes in accompanied by 2 family members today.  Since his hospital discharge she has done reasonably well.  He is currently residing in a nursing home.  His family is very involved in his care.  He has been without symptoms of angina or decompensation.  No  progressive dyspnea.  Lower extremity swelling resolved.  He has been placed back on supplemental oxygen via nasal cannula by his living facility.  Details of this are unclear.  No  falls or symptoms concerning for bleeding.  His weight is down 11 pounds by our scale when compared to his admission weight at [facility]  Family is supplementing calories with Boost/Ensure.  Blood pressures at his living facility have been stable.  Family is pleased with his progress noted during his admission.   Labs independently reviewed: 12/2021 - potassium 3.9, BUN 29, serum creatinine 0.97, Hgb 11.3, PLT 79, albumin 2.4, AST/ALT normal 11/2020 - TC 116, TG 142, HDL 34, LDL 53 07/2020 - A1c 6.0, TSH normal  Past Medical History:  Diagnosis Date   Actinic keratosis    Aortic atherosclerosis (HCC)    Aortic stenosis    BPH (benign prostatic hyperplasia)    Cataract 11/30/2021   Left Eye   Chronic HFrEF (heart failure with reduced ejection fraction) (HCC)    Chronic kidney disease, stage 3a (HCC)    Coronary artery calcification seen on CT scan    CVA (cerebral vascular accident) ([place])    Macular degeneration    Mitral regurgitation    PAF (paroxysmal atrial fibrillation) (HCC)    Tricuspid regurgitation     Past Surgical History:  Procedure Laterality Date   KNEE SURGERY     SHOULDER SURGERY      Current Medications: Current Meds  Medication Sig   Amino Acids-Protein Hydrolys (PRO-STAT) LIQD Take 30 mLs by mouth 3 times/day as needed-between meals & bedtime.   apixaban (ELIQUIS) 2.5 MG TABS tablet Take 1 tablet (2.5 mg total) by mouth 2 (two) times daily.   atorvastatin (LIPITOR) 40 MG tablet Take 1 tablet (40 mg total) by mouth daily.   Docusate Sodium 100 MG capsule Take 100 mg by mouth 2 (two) times daily.   esomeprazole (NEXIUM) 20 MG capsule Take 20 mg by mouth every other day.   fluticasone (FLONASE) 50 MCG/ACT nasal spray Place 2 sprays into both nostrils daily as needed for allergies or rhinitis.   furosemide (LASIX) 40 MG tablet Take 1 tablet (40 mg total) by mouth 2 (two) times daily.   metoprolol succinate (TOPROL-XL) 25 MG 24 hr tablet Take 1 tablet (25 mg  total) by mouth daily.   Multiple Vitamins-Minerals (PRESERVISION AREDS 2) CAPS Take 1 capsule by mouth 2 (two) times daily.   Omega-3 Fatty Acids (FISH OIL OMEGA-3 PO) Take by mouth daily.   OVER THE COUNTER MEDICATION Take 1 packet by mouth every morning. Multivitamin pak - [employer]   oxymetazoline (AFRIN) 0.05 % nasal spray Place 1 spray into both nostrils 2 (two) times daily as needed (nose bleeds).   tamsulosin (FLOMAX) 0.4 MG CAPS capsule Take 0.4 mg by mouth daily after supper.   vitamin B-12 1000 MCG tablet Take 1 tablet (1,000 mcg total) by mouth daily.    Allergies:   Patient has no known allergies.   Social History   Socioeconomic History   Marital status: Widowed    Spouse name: Not on file   Number of children: 2   Years of education: Not on file   Highest education level: Associate degree: occupational, Hotel manager, or vocational program  Occupational History   Not on file  Tobacco Use   Smoking status: Never   Smokeless tobacco: Never  Vaping Use   Vaping Use: Never used  Substance and Sexual Activity  Alcohol use: Never   Drug use: Never   Sexual activity: Not on file  Other Topics Concern   Not on file  Social History Narrative   11/02/20 lives with daughter   Social Determinants of Health   Financial Resource Strain: Not on file  Food Insecurity: Not on file  Transportation Needs: Not on file  Physical Activity: Not on file  Stress: Not on file  Social Connections: Not on file     Family History:  The patient's family history includes Colon cancer in his brother.  ROS:   12-point review of systems is negative unless otherwise noted in the HPI.   EKGs/Labs/Other Studies Reviewed:    Studies reviewed were summarized above. The additional studies were reviewed today:  2D echo 12/21/2021: 1. Left ventricular ejection fraction, by estimation, is 35 to 40%. The  left ventricle has moderately decreased function. The left ventricle   demonstrates global hypokinesis. There is mild concentric left ventricular  hypertrophy. Left ventricular  diastolic function could not be evaluated.   2. Right ventricular systolic function is moderately reduced. The right  ventricular size is mildly enlarged. There is moderately elevated  pulmonary artery systolic pressure. The estimated right ventricular  systolic pressure is 56.2 mmHg.   3. Left atrial size was severely dilated.   4. Right atrial size was severely dilated.   5. The mitral valve is grossly normal. Trivial mitral valve  regurgitation. No evidence of mitral stenosis.   6. The tricuspid valve is abnormal. Tricuspid valve regurgitation is mild  to moderate.   7. The aortic valve is tricuspid. There is moderate calcification of the  aortic valve. There is moderate thickening of the aortic valve. Aortic  valve regurgitation is not visualized. Mild to moderate aortic valve  stenosis. Aortic valve area, by VTI  measures 1.65 cm. Aortic valve mean gradient measures 17.3 mmHg. Aortic  valve Vmax measures 2.96 m/s.   8. The inferior vena cava is normal in size with <50% respiratory  variability, suggesting right atrial pressure of 8 mmHg. __________  2D echo 07/26/2020: 1. Left ventricular ejection fraction, by estimation, is 20 to 25%. The  left ventricle has severely decreased function. The left ventricle  demonstrates global hypokinesis. There is mild concentric left ventricular  hypertrophy. Left ventricular diastolic   parameters are indeterminate. Elevated left atrial pressure.   2. Right ventricular systolic function is moderately reduced. The right  ventricular size is normal.   3. Left atrial size was severely dilated.   4. Right atrial size was moderately dilated.   5. The mitral valve is grossly normal. Moderate to severe mitral valve  regurgitation. Mechanism is likely atrial functional.   6. Tricuspid valve regurgitation is moderate to severe.   7. The  aortic valve is calcified. There is moderate calcification of the  aortic valve. There is severe thickening of the aortic valve. Aortic valve  regurgitation is not visualized. At least mild aortic valve stenosis, as  we cannot exclude low flow low  gradient component.   8. The inferior vena cava is normal in size with greater than 50%  respiratory variability, suggesting right atrial pressure of 3 mmHg. __________  2D echo 09/24/2019 [facility]ef[facility]): AORTIC ROOT                   Size: Normal             Dissection: INDETERM FOR DISSECTION  AORTIC VALVE  Leaflets: Tricuspid                   Morphology: MILDLY THICKENED               Mobility: PARTIALLY MOBILE  LEFT VENTRICLE                   Size: MODERATELY ENLARGED           Anterior: HYPOCONTRACTILE            Contraction: MOD GLOBAL DECREASE            Lateral: HYPOCONTRACTILE             Closest EF: 30% (Estimated)                 Septal: HYPOCONTRACTILE              LV Masses: No Masses                       Apical: HYPOCONTRACTILE                    LVH: MILD LVH                      Inferior: HYPOCONTRACTILE                                                      Posterior: HYPOCONTRACTILE           Dias.FxClass: N/A  MITRAL VALVE               Leaflets: Normal                        Mobility: Fully mobile             Morphology: Normal  LEFT ATRIUM                   Size: MODERATELY ENLARGED          LA Masses: No masses              IA Septum: Normal IAS  MAIN PA                   Size: Normal  PULMONIC VALVE             Morphology: Normal                        Mobility: Fully mobile  RIGHT VENTRICLE              RV Masses: No Masses                         Size: MILDLY ENLARGED              Free Wall: Normal                     Contraction: Normal  TRICUSPID VALVE               Leaflets: Normal                        Mobility: Fully mobile  Morphology: Normal  RIGHT ATRIUM                   Size:  MODERATELY ENLARGED           RA Other: None                RA Mass: No masses  PERICARDIUM                  Fluid: No effusion                Pleural eff: SMALL PLEURAL EFFUSION  INFERIOR VENACAVA                   Size: DILATED Normal respiratory collapse  _________________________________________________________________________________________   DOPPLER ECHO and OTHER SPECIAL PROCEDURES                 Aortic: No AR                      MILD AS                         250.2 cm/sec peak vel      25.0 mmHg peak grad                         12.3 mmHg mean grad        1.2 cm^2 by DOPPLER                 Mitral: SEVERE MR                  No MS                         MV Inflow E Vel = 96.0 cm/sec      MV Annulus E'Vel = nm*                         E/E'Ratio = nm*              Tricuspid: MODERATE TR                No TS                         366.5 cm/sec peak TR vel   58.9 mmHg peak RV pressure              Pulmonary: TRIVIAL PR                 No PS  _________________________________________________________________________________________  INTERPRETATION  MODERATE LV SYSTOLIC DYSFUNCTION (See above)   WITH MILD LVH  NORMAL RIGHT VENTRICULAR SYSTOLIC FUNCTION  SEVERE VALVULAR REGURGITATION (See above)  MILD VALVULAR STENOSIS (See above)    EKG:  EKG is ordered today.  The EKG ordered today demonstrates A-fib, 79 bpm, left anterior fascicular block, LVH, poor R wave progression along the precordial leads, nonspecific lateral ST-T changes improved from prior  Recent Labs: 12/20/2021: ALT 35 12/21/2021: B Natriuretic Peptide 2,065.1 12/31/2021: BUN 41; Creatinine, Ser 1.02; Hemoglobin 12.0; Platelets 117; Potassium 3.2; Sodium 142  Recent Lipid Panel    Component Value Date/Time   CHOL 136 07/26/2020 0324   TRIG 85 07/26/2020 0324   HDL 30 (L) 07/26/2020 0324   CHOLHDL 4.5 07/26/2020 0324   VLDL 17  07/26/2020 0324   LDLCALC 89 07/26/2020 0324    PHYSICAL EXAM:    VS:  BP 110/82  (BP Location: Right Arm, Patient Position: Sitting, Cuff Size: Normal)   Pulse 79   Ht '5\' 5"'$  (1.651 m)   Wt 112 lb (50.8 kg)   SpO2 98% Comment: 3 L O2  BMI 18.64 kg/m   BMI: Body mass index is 18.64 kg/m.  Physical Exam Vitals reviewed.  Constitutional:      Appearance: He is normal weight.     Comments: Somnolent.  Family indicates he was woken up for his appointment earlier than usual routine.  HENT:     Head: Normocephalic and atraumatic.  Eyes:     General:        Right eye: No discharge.        Left eye: No discharge.  Neck:     Vascular: No JVD.  Cardiovascular:     Rate and Rhythm: Normal rate. Rhythm irregularly irregular.     Pulses:          Posterior tibial pulses are 2+ on the right side and 2+ on the left side.     Heart sounds: S1 normal and S2 normal. Heart sounds not distant. No midsystolic click and no opening snap. Murmur heard.     Systolic murmur is present with a grade of 1/6 at the upper right sternal border and lower left sternal border.     No friction rub.  Pulmonary:     Effort: Pulmonary effort is normal. No respiratory distress.     Breath sounds: Normal breath sounds. No decreased breath sounds, wheezing or rales.  Chest:     Chest wall: No tenderness.  Abdominal:     General: There is no distension.     Palpations: Abdomen is soft.     Tenderness: There is no abdominal tenderness.  Musculoskeletal:     Cervical back: Normal range of motion.     Right lower leg: No edema.     Left lower leg: No edema.  Skin:    General: Skin is warm and dry.     Nails: There is no clubbing.  Neurological:     Mental Status: He is alert and oriented to person, place, and time.  Psychiatric:        Speech: Speech normal.        Behavior: Behavior normal.        Thought Content: Thought content normal.        Judgment: Judgment normal.     Wt Readings from Last 3 Encounters:  12/31/21 112 lb (50.8 kg)  12/24/21 116 lb 6.5 oz (52.8 kg)  05/17/21 137  lb (62.1 kg)     ASSESSMENT & PLAN:   CAD involving the native coronary arteries without and elevated high-sensitivity troponin/aortic atherosclerosis/HLD: He is doing well and is without symptoms of angina or decompensation.  At baseline, he is very active, family shows me a video of him tap dancing in 10/2021.  He is somnolent today, though family indicates this is in the setting of him being woken up prior to his usual routine.  Previously not felt to be a candidate for invasive work-up given advanced age and comorbid conditions.  Continue risk factor modification and current medical therapy including apixaban in place of aspirin given underlying A-fib/flutter, and to minimize bleeding risk, along with atorvastatin and Toprol-XL.  HFrEF: He appears euvolemic and well compensated.  He remains on Toprol-XL 25 mg daily  along with furosemide 40 mg twice daily.  Not currently on ACE inhibitor/ARB/ARNI/MRA/SGLT2i in an effort to minimize significant hypotension and fall risk.  Previously tap dancing and swinging golf clubs.  Difficult to assess NYHA class today.  Check BMP.  Continue nutritional supplements.  Persistent A-fib/flutter: Rate controlled with Toprol-XL.  CHA2DS2-VASc at least 6 (CHF, age x2, x2, vascular disease).  He remains on renally dosed apixaban (age and weight) without symptoms concerning for bleeding or falls.  Check BMP and CBC.  Mitral regurgitation: Trivial by most recent echo.  Prior echo in 07/2020 notable for moderate to severe mitral valve regurgitation.  Previously felt to not be a candidate for TEE or invasive valve repair for further evaluation given advanced age and comorbid conditions.  Continue medical therapy.  Aortic stenosis: Mild to moderate by echo earlier this month with a mean gradient of 17.3 mmHg and a valve area of 1.65 cm.  Previously felt to not be a candidate for invasive valve repair.  Continue medical therapy.  AKI: Resolved at discharge.  Check  BMP.  Macrocytic anemia: Stable at discharge.  Check CBC.  Chronic hypoxic respiratory failure: Oxygen was weaned to room air at discharge.  Presents with supplemental oxygen via nasal cannula at 3 L, which per family was initiated by his living facility.  Would recommend weaning supplemental oxygen as tolerated.  Ongoing management per living facility.    Disposition: F/u with Dr. Saunders Revel to establish care in [place] in 1 month.   Medication Adjustments/Labs and Tests Ordered: Current medicines are reviewed at length with the patient today.  Concerns regarding medicines are outlined above. Medication changes, Labs and Tests ordered today are summarized above and listed in the Patient Instructions accessible in Encounters.   Signed, Christell Faith, PA-C 12/31/2021 1:02 PM     [address] [place], [place] [address] [phone]

## 2021-12-31 ENCOUNTER — Ambulatory Visit: Payer: Medicare Other | Attending: Physician Assistant | Admitting: Physician Assistant

## 2021-12-31 ENCOUNTER — Other Ambulatory Visit
Admission: RE | Admit: 2021-12-31 | Discharge: 2021-12-31 | Disposition: A | Discharge status: Home / Self Care | Payer: No Typology Code available for payment source | Attending: Physician Assistant | Admitting: Physician Assistant

## 2021-12-31 ENCOUNTER — Encounter: Payer: Self-pay | Admitting: Physician Assistant

## 2021-12-31 ENCOUNTER — Telehealth: Payer: Self-pay | Admitting: *Deleted

## 2021-12-31 VITALS — BP 110/82 | HR 79 | Ht 65.0 in | Wt 112.0 lb

## 2021-12-31 DIAGNOSIS — I340 Nonrheumatic mitral (valve) insufficiency: Secondary | ICD-10-CM | POA: Diagnosis present

## 2021-12-31 DIAGNOSIS — I2510 Atherosclerotic heart disease of native coronary artery without angina pectoris: Secondary | ICD-10-CM

## 2021-12-31 DIAGNOSIS — I350 Nonrheumatic aortic (valve) stenosis: Secondary | ICD-10-CM | POA: Diagnosis present

## 2021-12-31 DIAGNOSIS — N179 Acute kidney failure, unspecified: Secondary | ICD-10-CM | POA: Insufficient documentation

## 2021-12-31 DIAGNOSIS — I5043 Acute on chronic combined systolic (congestive) and diastolic (congestive) heart failure: Secondary | ICD-10-CM | POA: Diagnosis not present

## 2021-12-31 DIAGNOSIS — I5020 Unspecified systolic (congestive) heart failure: Secondary | ICD-10-CM | POA: Diagnosis present

## 2021-12-31 DIAGNOSIS — I4819 Other persistent atrial fibrillation: Secondary | ICD-10-CM | POA: Diagnosis not present

## 2021-12-31 DIAGNOSIS — I480 Paroxysmal atrial fibrillation: Secondary | ICD-10-CM | POA: Insufficient documentation

## 2021-12-31 DIAGNOSIS — J9611 Chronic respiratory failure with hypoxia: Secondary | ICD-10-CM | POA: Insufficient documentation

## 2021-12-31 DIAGNOSIS — D539 Nutritional anemia, unspecified: Secondary | ICD-10-CM | POA: Diagnosis present

## 2021-12-31 DIAGNOSIS — I5021 Acute systolic (congestive) heart failure: Secondary | ICD-10-CM | POA: Diagnosis present

## 2021-12-31 LAB — CBC
HCT: 37.2 % — ABNORMAL LOW (ref 39.0–52.0)
Hemoglobin: 12 g/dL — ABNORMAL LOW (ref 13.0–17.0)
MCH: 34.6 pg — ABNORMAL HIGH (ref 26.0–34.0)
MCHC: 32.3 g/dL (ref 30.0–36.0)
MCV: 107.2 fL — ABNORMAL HIGH (ref 80.0–100.0)
Platelets: 117 10*3/uL — ABNORMAL LOW (ref 150–400)
RBC: 3.47 MIL/uL — ABNORMAL LOW (ref 4.22–5.81)
RDW: 15.6 % — ABNORMAL HIGH (ref 11.5–15.5)
WBC: 4.9 10*3/uL (ref 4.0–10.5)
nRBC: 0 % (ref 0.0–0.2)

## 2021-12-31 LAB — BASIC METABOLIC PANEL
Anion gap: 12 (ref 5–15)
BUN: 41 mg/dL — ABNORMAL HIGH (ref 8–23)
CO2: 41 mmol/L — ABNORMAL HIGH (ref 22–32)
Calcium: 8.6 mg/dL — ABNORMAL LOW (ref 8.9–10.3)
Chloride: 89 mmol/L — ABNORMAL LOW (ref 98–111)
Creatinine, Ser: 1.02 mg/dL (ref 0.61–1.24)
GFR, Estimated: >60 mL/min (ref >60)
Glucose, Bld: 107 mg/dL — ABNORMAL HIGH (ref 70–99)
Potassium: 3.2 mmol/L — ABNORMAL LOW (ref 3.5–5.1)
Sodium: 142 mmol/L (ref 135–145)

## 2021-12-31 NOTE — Telephone Encounter (Signed)
Left voicemail message to call back for review of results and recommendations.  

## 2021-12-31 NOTE — Telephone Encounter (Signed)
-----   Message from Rise Mu, PA-C sent at 12/31/2021  1:01 PM EDT ----- -Potassium low -Evidence of volume depletion with low chloride, elevated CO2, elevated BUN, and uptrending serum creatinine -Hemoglobin and platelet count low though improved from prior, possibly due to a mild degree of hemoconcentration (volume depletion) when taking into account his chemistries as well  Recommendations: -Hold furosemide for 2 days followed by decreasing the dose to 40 mg daily -Start KCl 20 mEq daily -Follow-up with PCP/facility Dr. For ongoing management of mild anemia and thrombocytopenia -Follow-up BMP in 1 week

## 2021-12-31 NOTE — Patient Instructions (Signed)
Work on weaning off oxygen.   Medication Instructions:  Your physician recommends that you continue on your current medications as directed. Please refer to the Current Medication list given to you today.  *If you need a refill on your cardiac medications before your next appointment, please call your pharmacy*   Lab Work: BMET & CBC today over at the [facility] entrance and check in at registration.   If you have labs (blood work) drawn today and your tests are completely normal, you will receive your results only by: [place] (if you have MyChart) OR A paper copy in the mail If you have any lab test that is abnormal or we need to change your treatment, we will call you to review the results.   Testing/Procedures: None   Follow-Up: At [facility], you and your health needs are our priority.  As part of our continuing mission to provide you with exceptional heart care, we have created designated Provider Care Teams.  These Care Teams include your primary Cardiologist (physician) and Advanced Practice Providers (APPs -  Physician Assistants and Nurse Practitioners) who all work together to provide you with the care you need, when you need it.  Your next appointment:   Next available with Dr. Saunders Revel to establish care  The format for your next appointment:   In Person  Provider:   Nelva Bush, MD      Important Information About [place]

## 2022-01-03 NOTE — Telephone Encounter (Signed)
Returning nurses call from 9/22 in regards to results. Please advise

## 2022-01-04 MED ORDER — POTASSIUM CHLORIDE CRYS ER 20 MEQ PO TBCR: 20.0000 meq | EXTENDED_RELEASE_TABLET | Freq: Every day | ORAL | 3 refills | Status: DC

## 2022-01-04 MED ORDER — FUROSEMIDE 40 MG PO TABS: 40.0000 mg | ORAL_TABLET | Freq: Every day | ORAL | 0 refills | Status: DC

## 2022-01-04 NOTE — Telephone Encounter (Signed)
Late entry:  Spoke with daughter and reviewed results and recommendations. She requested that we call [name] with those orders and provided me with number. She had no further questions at this time.

## 2022-01-04 NOTE — Telephone Encounter (Signed)
Spoke with [name] and reviewed we have orders for patient. They provided me fax number and sent those orders over to their office with instructions to call back if any further questions. Successful transmission received.

## 2022-01-04 NOTE — Addendum Note (Signed)
Addended by: Valora Corporal on: 01/04/2022 11:03 AM   Modules accepted: Orders

## 2022-01-05 ENCOUNTER — Telehealth: Payer: Self-pay | Admitting: Physician Assistant

## 2022-01-05 NOTE — Telephone Encounter (Signed)
New Message:     Nurse called and said please fax order for patient's lab work. Please fax to [phone].

## 2022-01-05 NOTE — Telephone Encounter (Signed)
Lab orders have been faxed

## 2022-01-06 ENCOUNTER — Telehealth: Payer: Self-pay | Admitting: Physician Assistant

## 2022-01-06 NOTE — Telephone Encounter (Signed)
Patient's daughter called and said please re-fax order for patient's lab work. Please fax to [phone].

## 2022-01-07 NOTE — Telephone Encounter (Signed)
Spoke with patients daughter per release form. Advised that I had faxed those orders twice and received successful transmission with each one. She was very appreciative and requested that I please send my chart message with those instructions as well. No further needs at this time.

## 2022-01-07 NOTE — Telephone Encounter (Signed)
Left voicemail message that orders were faxed to that same number and confirmation received but that I would refax and to give [place] a call back if any further questions.

## 2022-01-13 ENCOUNTER — Emergency Department (HOSPITAL_COMMUNITY)
Admission: EM | Admit: 2022-01-13 | Discharge: 2022-01-13 | Disposition: A | Discharge status: Skilled Nursing Facility | Payer: Medicare Other | Attending: Emergency Medicine | Admitting: Emergency Medicine

## 2022-01-13 ENCOUNTER — Emergency Department (HOSPITAL_COMMUNITY): Payer: Medicare Other

## 2022-01-13 ENCOUNTER — Encounter (HOSPITAL_COMMUNITY): Payer: Self-pay | Admitting: Emergency Medicine

## 2022-01-13 DIAGNOSIS — S0003XA Contusion of scalp, initial encounter: Secondary | ICD-10-CM | POA: Insufficient documentation

## 2022-01-13 DIAGNOSIS — Y92002 Bathroom of unspecified non-institutional (private) residence single-family (private) house as the place of occurrence of the external cause: Secondary | ICD-10-CM | POA: Diagnosis not present

## 2022-01-13 DIAGNOSIS — S0990XA Unspecified injury of head, initial encounter: Secondary | ICD-10-CM | POA: Diagnosis present

## 2022-01-13 DIAGNOSIS — Z79899 Other long term (current) drug therapy: Secondary | ICD-10-CM | POA: Diagnosis not present

## 2022-01-13 DIAGNOSIS — Z7901 Long term (current) use of anticoagulants: Secondary | ICD-10-CM | POA: Insufficient documentation

## 2022-01-13 DIAGNOSIS — W19XXXA Unspecified fall, initial encounter: Secondary | ICD-10-CM

## 2022-01-13 DIAGNOSIS — I509 Heart failure, unspecified: Secondary | ICD-10-CM | POA: Insufficient documentation

## 2022-01-13 DIAGNOSIS — I4891 Unspecified atrial fibrillation: Secondary | ICD-10-CM | POA: Diagnosis not present

## 2022-01-13 DIAGNOSIS — W010XXA Fall on same level from slipping, tripping and stumbling without subsequent striking against object, initial encounter: Secondary | ICD-10-CM | POA: Insufficient documentation

## 2022-01-13 LAB — CBC
HCT: 34.1 % — ABNORMAL LOW (ref 39.0–52.0)
Hemoglobin: 11.2 g/dL — ABNORMAL LOW (ref 13.0–17.0)
MCH: 35.9 pg — ABNORMAL HIGH (ref 26.0–34.0)
MCHC: 32.8 g/dL (ref 30.0–36.0)
MCV: 109.3 fL — ABNORMAL HIGH (ref 80.0–100.0)
Platelets: 115 10*3/uL — ABNORMAL LOW (ref 150–400)
RBC: 3.12 MIL/uL — ABNORMAL LOW (ref 4.22–5.81)
RDW: 15.9 % — ABNORMAL HIGH (ref 11.5–15.5)
WBC: 4.1 10*3/uL (ref 4.0–10.5)
nRBC: 0 % (ref 0.0–0.2)

## 2022-01-13 LAB — SAMPLE TO BLOOD BANK

## 2022-01-13 LAB — PROTIME-INR
INR: 1.6 — ABNORMAL HIGH (ref 0.8–1.2)
Prothrombin Time: 18.7 seconds — ABNORMAL HIGH (ref 11.4–15.2)

## 2022-01-13 LAB — COMPREHENSIVE METABOLIC PANEL
ALT: 53 U/L — ABNORMAL HIGH (ref 0–44)
AST: 51 U/L — ABNORMAL HIGH (ref 15–41)
Albumin: 2.5 g/dL — ABNORMAL LOW (ref 3.5–5.0)
Alkaline Phosphatase: 82 U/L (ref 38–126)
Anion gap: 12 (ref 5–15)
BUN: 39 mg/dL — ABNORMAL HIGH (ref 8–23)
CO2: 31 mmol/L (ref 22–32)
Calcium: 8.7 mg/dL — ABNORMAL LOW (ref 8.9–10.3)
Chloride: 97 mmol/L — ABNORMAL LOW (ref 98–111)
Creatinine, Ser: 1.09 mg/dL (ref 0.61–1.24)
GFR, Estimated: >60 mL/min (ref >60)
Glucose, Bld: 111 mg/dL — ABNORMAL HIGH (ref 70–99)
Potassium: 3.4 mmol/L — ABNORMAL LOW (ref 3.5–5.1)
Sodium: 140 mmol/L (ref 135–145)
Total Bilirubin: 0.9 mg/dL (ref 0.3–1.2)
Total Protein: 5.7 g/dL — ABNORMAL LOW (ref 6.5–8.1)

## 2022-01-13 MED ORDER — SODIUM CHLORIDE 0.9 % IV BOLUS
500.0000 mL | Freq: Once | INTRAVENOUS | Status: AC
  Administered 2022-01-13: 500 mL via INTRAVENOUS

## 2022-01-13 NOTE — ED Provider Notes (Signed)
[facility] EMERGENCY DEPARTMENT Provider Note   CSN: [id number] Arrival date & time: 01/13/22  0818     History  No chief complaint on file.   Alejandro Moore is a 88 y.o. male.  HPI     88 year old male comes in with chief complaint of mechanical fall. Patient has history of A-fib on Eliquis, CHF.  He is currently residing at [facility] center, after being admitted for acute CHF in September.  According to the patient, he had a slip and a fall in the bathroom.  The surface was allegedly wet.  Patient has no primary complaints from his side.  Specifically, he denies any severe headache, neck pain, any new neurologic symptoms, lower extremity pain.  Home Medications Prior to Admission medications   Medication Sig Start Date End Date Taking? Authorizing Provider  Amino Acids-Protein Hydrolys (PRO-STAT) LIQD Take 30 mLs by mouth 3 times/day as needed-between meals & bedtime.    [provider]  apixaban (ELIQUIS) 2.5 MG TABS tablet Take 1 tablet (2.5 mg total) by mouth 2 (two) times daily. 07/27/20   Bailey-Modzik, Delila A, NP  atorvastatin (LIPITOR) 40 MG tablet Take 1 tablet (40 mg total) by mouth daily. 07/27/20   Bailey-Modzik, Delila A, NP  Docusate Sodium 100 MG capsule Take 100 mg by mouth 2 (two) times daily.    [provider]  esomeprazole (NEXIUM) 20 MG capsule Take 20 mg by mouth every other day.    [provider]  fluticasone (FLONASE) 50 MCG/ACT nasal spray Place 2 sprays into both nostrils daily as needed for allergies or rhinitis.    [provider]  furosemide (LASIX) 40 MG tablet Take 1 tablet (40 mg total) by mouth daily. 01/04/22   Dunn, Areta Haber, PA-C  metoprolol succinate (TOPROL-XL) 25 MG 24 hr tablet Take 1 tablet (25 mg total) by mouth daily. 12/24/21   Erskine Emery, MD  Multiple Vitamins-Minerals (PRESERVISION AREDS 2) CAPS Take 1 capsule by mouth 2 (two) times daily.    [provider]   Omega-3 Fatty Acids (FISH OIL OMEGA-3 PO) Take by mouth daily.    [provider]  OVER THE COUNTER MEDICATION Take 1 packet by mouth every morning. Multivitamin pak - [employer]    [provider]  oxymetazoline (AFRIN) 0.05 % nasal spray Place 1 spray into both nostrils 2 (two) times daily as needed (nose bleeds).    [provider]  potassium chloride SA (KLOR-CON M) 20 MEQ tablet Take 1 tablet (20 mEq total) by mouth daily. 01/04/22   Rise Mu, PA-C  tamsulosin (FLOMAX) 0.4 MG CAPS capsule Take 0.4 mg by mouth daily after supper. 04/26/19   [provider]  vitamin B-12 1000 MCG tablet Take 1 tablet (1,000 mcg total) by mouth daily. 07/27/20   Bailey-Modzik, Morene Crocker, NP      Allergies    Patient has no known allergies.    Review of Systems   Review of Systems  Physical Exam Updated Vital Signs BP 90/60 (BP Location: Right Arm)   Pulse 75   Temp 97.7 F (36.5 C) (Oral)   Resp 18   Ht '5\' 5"'$  (1.651 m)   Wt 53.1 kg   SpO2 100%   BMI 19.47 kg/m  Physical Exam Vitals and nursing note reviewed.  Constitutional:      Appearance: He is well-developed.  HENT:     Head: Atraumatic.  Neck:     Comments: No  midline c-spine tenderness, pt able to turn head to 45 degrees bilaterally without any pain and able to flex neck to the chest and extend without any pain or neurologic symptoms.  Cardiovascular:     Rate and Rhythm: Normal rate.  Pulmonary:     Effort: Pulmonary effort is normal.  Musculoskeletal:     Cervical back: Neck supple.     Comments: Head to toe evaluation shows small hematoma of the scalp. No facial abrasions, no spine step offs, crepitus of the chest or neck, no tenderness to palpation of the bilateral upper and lower extremities, no gross deformities, no chest tenderness, no pelvic pain.    Skin:    General: Skin is warm.  Neurological:     Mental Status: He is alert and oriented to person, place, and  time.     ED Results / Procedures / Treatments   Labs (all labs ordered are listed, but only abnormal results are displayed) Labs Reviewed  CBC - Abnormal; Notable for the following components:      Result Value   RBC 3.12 (*)    Hemoglobin 11.2 (*)    HCT 34.1 (*)    MCV 109.3 (*)    MCH 35.9 (*)    RDW 15.9 (*)    Platelets 115 (*)    All other components within normal limits  PROTIME-INR - Abnormal; Notable for the following components:   Prothrombin Time 18.7 (*)    INR 1.6 (*)    All other components within normal limits  COMPREHENSIVE METABOLIC PANEL  URINALYSIS, ROUTINE W REFLEX MICROSCOPIC  SAMPLE TO BLOOD BANK    EKG None  Radiology CT HEAD WO CONTRAST  Result Date: 01/13/2022 CLINICAL DATA:  Head trauma EXAM: CT HEAD WITHOUT CONTRAST TECHNIQUE: Contiguous axial images were obtained from the base of the skull through the vertex without intravenous contrast. RADIATION DOSE REDUCTION: This exam was performed according to the departmental dose-optimization program which includes automated exposure control, adjustment of the mA and/or kV according to patient size and/or use of iterative reconstruction technique. COMPARISON:  Head CT dated December 20, 2021 FINDINGS: Brain: Generalized atrophy and chronic white matter ischemic change. No evidence of acute infarction, hemorrhage, hydrocephalus, extra-axial collection or mass lesion/mass effect. Vascular: No hyperdense vessel or unexpected calcification. Skull: Normal. Negative for fracture or focal lesion. Sinuses/Orbits: No acute finding. Other: None. IMPRESSION: No acute intracranial abnormality. Electronically Signed   By: Yetta Glassman M.D.   On: 01/13/2022 08:55   DG Chest Port 1 View  Result Date: 01/13/2022 CLINICAL DATA:  Provided history: Trauma. Additional history provided: Fall. History of aortic stenosis, heart failure, CKD, CVA, mitral and tricuspid regurgitation. EXAM: PORTABLE CHEST 1 VIEW COMPARISON:  Prior  chest radiographs 12/22/2021 and earlier FINDINGS: Mild cardiomegaly. Aortic atherosclerosis. Bilateral pleural effusions (small right, trace left). Underlying atelectasis and/or consolidation at the right lung base. Foci of linear atelectasis or scarring within the periphery of the left mid and lower lung. No evidence of pneumothorax. No acute bony abnormality identified. IMPRESSION: Bilateral pleural effusions (small right, trace left). Underlying atelectasis and/or consolidation at the right lung base. Foci of linear atelectasis and/or scarring within the periphery of the left mid and lower lung. Mild cardiomegaly. Aortic Atherosclerosis (ICD10-I70.0). Electronically Signed   By: Kellie Simmering D.O.   On: 01/13/2022 08:46    Procedures Procedures    Medications Ordered in ED Medications - No data to display  ED Course/ Medical Decision Making/ A&P  Medical Decision Making Amount and/or Complexity of Data Reviewed Labs: ordered. Radiology: ordered.   88 year old male with history of paroxysmal A-fib on Eliquis, CHF comes in with chief complaint of mechanical fall.  Patient is on chronic oxygen.  The fall was mechanical in nature.  He is on blood thinner, therefore a level 2 trauma was activated.  Patient has no specific complaints from his side after the fall.  On exam, patient has no gross deformity, no tenderness over the C-spine or T/L-spine.  He is moving all 4 extremities and the lower extremity strength is 4+ out of 5.  No pelvic tenderness.  CT scan of the brain, chest x-ray ordered.  Results have been independently interpreted, there is no evidence of brain bleed or pneumothorax.  There is some nonspecific changes seen on the x-ray per radiology note, however patient has no new cough, fevers.  Doubt meaningful cause.  BP was 90/60.  There was no dizziness, there is no orthostatic complaints..  CBC interpreted independently, White count is normal.  Patient  denies any dizziness, lightheadedness.  Discussed case with patient's daughter.  She states that the last 2 fall he has had, appeared to be provoked by anesthesia on the first 1 and wet surface on this 1.  She is open to having discussion about balance, if PT recommends discontinuing the blood thinners, they will follow through.  On discharge, we will make sure that PT at [facility] specifically assess this patient from balance perspective and if there is any concerning findings to communicate that to the family so that they can make an informed decision whether to continue Eliquis or not.    Final Clinical Impression(s) / ED Diagnoses Final diagnoses:  Fall, initial encounter    Rx / DC Orders ED Discharge Orders     None         Varney Biles, MD 01/13/22 [phone]

## 2022-01-13 NOTE — Discharge Instructions (Addendum)
We saw you in the ER after you had a fall. All the imaging results are normal, no fractures seen. No evidence of brain bleed. Please be very careful with walking, and do everything possible to prevent falls.  Alejandro Moore has had 2 falls in the last month.   It appears that the falls are likely preventable , however , please consider giving Physical Therapy with specific attention to his balance.  If his balance appears to be worsening and he is at high risk for increasing falls, please communicate this finding to the family so that they can decide with their primary care doctor/cardiology if it is worthwhile continuing the blood thinners.

## 2022-01-13 NOTE — ED Notes (Signed)
PTAR called at 11:19am - "next few minutes"

## 2022-01-13 NOTE — Discharge Planning (Signed)
Pt to return to [facility] at discharge.

## 2022-01-13 NOTE — ED Notes (Signed)
Trauma Response Nurse Documentation   Alejandro Moore is a 88 y.o. male arriving to [facility] ED via EMS  On Eliquis (apixaban) daily. Trauma was activated as a Level 2 by ED Charge RN based on the following trauma criteria Elderly patients > 65 with head trauma on anti-coagulation (excluding ASA). Trauma team at the bedside on patient arrival.   Patient cleared for CT by Dr. Kathrynn Humble. Pt transported to CT with trauma response nurse present to monitor. RN remained with the patient throughout their absence from the department for clinical observation.   GCS 15.  History   Past Medical History:  Diagnosis Date   Actinic keratosis    Aortic atherosclerosis (HCC)    Aortic stenosis    BPH (benign prostatic hyperplasia)    Cataract 11/30/2021   Left Eye   Chronic HFrEF (heart failure with reduced ejection fraction) (HCC)    Chronic kidney disease, stage 3a (HCC)    Coronary artery calcification seen on CT scan    CVA (cerebral vascular accident) ([place])    Macular degeneration    Mitral regurgitation    PAF (paroxysmal atrial fibrillation) (HCC)    Tricuspid regurgitation      Past Surgical History:  Procedure Laterality Date   KNEE SURGERY     SHOULDER SURGERY        Initial Focused Assessment (If applicable, or please see trauma documentation): - GCS 15 - A/Ox4 - VSS - Small abrasion to R temple - PERRLA  CT's Completed:   CT Head   Interventions:  - 20G PIV to L FA - Labs including PT/INR - Assessed neck and back / both negative for pain, step-offs or injuries - CXR - CT head - EKG attached - Attached home O2 to wall meter; 2L via [place] - Gave pt call bell and bed in lowest position  Plan for disposition:  Discharge home - back to facility  Consults completed:  none at 0900.  Event Summary: Pt is coming from [facility] facility.  Pt had been walked to the bathroom by staff.  Pt attempted to get up by himself to return from bathroom when he fell,  striking the right side of his head.  No LOC.  Small abrasion to R temple region of head.  Pt is on Eliquis.   Bedside handoff with ED RN Carlis Abbott.    Clovis Cao  Trauma Response RN  Please call TRN at [phone] for further assistance.

## 2022-01-13 NOTE — ED Triage Notes (Signed)
Patient BIB [facility] from [facility] for evaluation after a mechanical fall, patient hit his head, is on eliquis. No LOC, patient has abrasions to top of head, is alert, oriented, and in no apparent distress at this time.  BP 110/80 HR 60 SpO2 >90% on 2L [place] O2 (baseline O2)

## 2022-02-11 ENCOUNTER — Other Ambulatory Visit
Admission: RE | Admit: 2022-02-11 | Discharge: 2022-02-11 | Disposition: A | Discharge status: Home / Self Care | Payer: Medicare Other | Source: Ambulatory Visit | Attending: Medical | Admitting: Medical

## 2022-02-11 ENCOUNTER — Encounter: Payer: Self-pay | Admitting: Medical

## 2022-02-11 ENCOUNTER — Ambulatory Visit: Payer: Medicare Other | Attending: Medical | Admitting: Medical

## 2022-02-11 VITALS — BP 116/68 | HR 78 | Ht 65.0 in | Wt 111.0 lb

## 2022-02-11 DIAGNOSIS — I4819 Other persistent atrial fibrillation: Secondary | ICD-10-CM | POA: Diagnosis not present

## 2022-02-11 DIAGNOSIS — I2510 Atherosclerotic heart disease of native coronary artery without angina pectoris: Secondary | ICD-10-CM | POA: Insufficient documentation

## 2022-02-11 DIAGNOSIS — I5020 Unspecified systolic (congestive) heart failure: Secondary | ICD-10-CM | POA: Insufficient documentation

## 2022-02-11 DIAGNOSIS — I350 Nonrheumatic aortic (valve) stenosis: Secondary | ICD-10-CM | POA: Insufficient documentation

## 2022-02-11 DIAGNOSIS — E785 Hyperlipidemia, unspecified: Secondary | ICD-10-CM | POA: Diagnosis not present

## 2022-02-11 LAB — BASIC METABOLIC PANEL
Anion gap: 8 (ref 5–15)
BUN: 41 mg/dL — ABNORMAL HIGH (ref 8–23)
CO2: 33 mmol/L — ABNORMAL HIGH (ref 22–32)
Calcium: 8.8 mg/dL — ABNORMAL LOW (ref 8.9–10.3)
Chloride: 99 mmol/L (ref 98–111)
Creatinine, Ser: 1.16 mg/dL (ref 0.61–1.24)
GFR, Estimated: 56 mL/min — ABNORMAL LOW (ref >60)
Glucose, Bld: 115 mg/dL — ABNORMAL HIGH (ref 70–99)
Potassium: 4.4 mmol/L (ref 3.5–5.1)
Sodium: 140 mmol/L (ref 135–145)

## 2022-02-11 LAB — LIPID PANEL
Cholesterol: 115 mg/dL (ref 0–200)
HDL: 41 mg/dL (ref >40)
LDL Cholesterol: 62 mg/dL (ref 0–99)
Total CHOL/HDL Ratio: 2.8 RATIO
Triglycerides: 61 mg/dL (ref <150)
VLDL: 12 mg/dL (ref 0–40)

## 2022-02-11 MED ORDER — DAPAGLIFLOZIN PROPANEDIOL 10 MG PO TABS: 10.0000 mg | ORAL_TABLET | Freq: Every day | ORAL | 0 refills | Status: DC

## 2022-02-11 MED ORDER — POTASSIUM CHLORIDE ER 10 MEQ PO TBCR: 10.0000 meq | EXTENDED_RELEASE_TABLET | Freq: Every day | ORAL | 0 refills | Status: DC

## 2022-02-11 MED ORDER — FUROSEMIDE 20 MG PO TABS: 20.0000 mg | ORAL_TABLET | Freq: Every day | ORAL | 0 refills | Status: DC

## 2022-02-11 MED ORDER — METOPROLOL SUCCINATE ER 25 MG PO TB24: 25.0000 mg | ORAL_TABLET | Freq: Every day | ORAL | 0 refills | Status: DC

## 2022-02-11 NOTE — Progress Notes (Unsigned)
Cardiology Office Note:    Date:  02/13/2022   ID:  Alejandro Moore, DOB Jul 24, 1919, MRN [id number]  PCP:  Alejandro Crouch, MD  [facility] Cardiologist:  Alejandro Latch, MD  [facility] Electrophysiologist:  None   Referring MD: Alejandro Crouch, MD   Chief Complaint: 1 month follow-up  History of Present Illness:    Alejandro Moore is a 88 y.o. male with a hx of CAD, HFrEF, PAF/flutter, CVA, mitral regurgitation, aortic stenosis, aortic atherosclerosis, hyperlipidemia, macrocytic anemia, and macular degeneration who presents for hospital follow-up.  The patient was previously followed by Dr. Drusilla Moore, though requested transition of care to [facility] health cardiology.  Patient was seen in the ER in 2021 for shortness of breath found to have heart failure with bedside echo showing EF of 40%.  He declined admission.  He followed up with [facility] and CHF clinic thereafter and was found to be in A-fib.  In follow-up with Dr. Drusilla Moore he was back in sinus rhythm.  Echo 04/17/2019 showed EF 30%, mild LVH, severe MR, moderate tricuspid regurgitation, and mild aortic stenosis.  Prior outside office cardiology notes stated the patient has coronary artery disease previously diagnosed by imaging study years ago.  CT abdomen/pelvis in 2021 showed coronary atherosclerosis of the left circumflex and RCA.  He has not had prior stress test or cath.  He has been medically managed.  Echo 07/29/2020 showed an EF of 20-25%, global hypokinesis, mild concentric LVH, indeterminate LV diastolic function parameters, moderately reduced RV systolic function with normal ventricular cavity size, severely dilated left atrium, moderately dilated right atrium, moderate to severe mitral regurgitation, moderate to severe tricuspid regurgitation, calcified aortic valve with at least mild stenosis (unable to exclude low-flow low gradient component), and an estimated right atrial pressure of 3 mmHg.   Patient was last seen  12/31/2021 was doing well since his discharge.  He was using supplemental oxygen.  It was very somnolent, was very different from baseline.  Not felt to be a candidate for invasive work-up given advanced age and comorbid conditions.  Blood pressure was limiting guideline directed medical therapy for HFrEF.  Patient was in rate controlled A-fib/flutter without symptoms.  Today,  the patient is overall doing well. The patient is wondering about stopping the Lipitor. The family is wanting to take red rice yeast. The patient denies chest pain or shortness of breath. No swelling on the feet, orthopnea, or pnd. No palpitations, dizziness or lightheadedness. He is off supplemental O2.   Past Medical History:  Diagnosis Date   Actinic keratosis    Aortic atherosclerosis (HCC)    Aortic stenosis    BPH (benign prostatic hyperplasia)    Cataract 11/30/2021   Left Eye   Chronic HFrEF (heart failure with reduced ejection fraction) (HCC)    Chronic kidney disease, stage 3a (HCC)    Coronary artery calcification seen on CT scan    CVA (cerebral vascular accident) ([place])    Macular degeneration    Mitral regurgitation    PAF (paroxysmal atrial fibrillation) (HCC)    Tricuspid regurgitation     Past Surgical History:  Procedure Laterality Date   KNEE SURGERY     SHOULDER SURGERY      Current Medications: Current Meds  Medication Sig   Amino Acids-Protein Hydrolys (PRO-STAT) LIQD Take 30 mLs by mouth 3 times/day as needed-between meals & bedtime.   apixaban (ELIQUIS) 2.5 MG TABS tablet Take 1 tablet (2.5 mg total) by mouth 2 (two) times daily.  dapagliflozin propanediol (FARXIGA) 10 MG TABS tablet Take 1 tablet (10 mg total) by mouth daily before breakfast.   esomeprazole (NEXIUM) 20 MG capsule Take 20 mg by mouth every other day.   fluticasone (FLONASE) 50 MCG/ACT nasal spray Place 2 sprays into both nostrils daily as needed for allergies or rhinitis.   furosemide (LASIX) 20 MG tablet Take 1  tablet (20 mg total) by mouth daily.   Multiple Vitamins-Minerals (PRESERVISION AREDS 2) CAPS Take 1 capsule by mouth 2 (two) times daily.   Omega-3 Fatty Acids (Moore OIL OMEGA-3 PO) Take by mouth daily.   OVER THE COUNTER MEDICATION Take 1 packet by mouth every morning. Multivitamin pak - [employer] [facility]   oxymetazoline (AFRIN) 0.05 % nasal spray Place 1 spray into both nostrils 2 (two) times daily as needed (nose bleeds).   potassium chloride (KLOR-CON) 10 MEQ tablet Take 1 tablet (10 mEq total) by mouth daily.   tamsulosin (FLOMAX) 0.4 MG CAPS capsule Take 0.4 mg by mouth daily after supper.   vitamin B-12 1000 MCG tablet Take 1 tablet (1,000 mcg total) by mouth daily.   [DISCONTINUED] atorvastatin (LIPITOR) 40 MG tablet Take 1 tablet (40 mg total) by mouth daily.   [DISCONTINUED] furosemide (LASIX) 40 MG tablet Take 1 tablet (40 mg total) by mouth daily.   [DISCONTINUED] metoprolol succinate (TOPROL-XL) 25 MG 24 hr tablet Take 1 tablet (25 mg total) by mouth daily.   [DISCONTINUED] potassium chloride SA (KLOR-CON M) 20 MEQ tablet Take 1 tablet (20 mEq total) by mouth daily.     Allergies:   Patient has no known allergies.   Social History   Socioeconomic History   Marital status: Widowed    Spouse name: Not on file   Number of children: 2   Years of education: Not on file   Highest education level: Associate degree: occupational, Hotel manager, or vocational program  Occupational History   Not on file  Tobacco Use   Smoking status: Never   Smokeless tobacco: Never  Vaping Use   Vaping Use: Never used  Substance and Sexual Activity   Alcohol use: Never   Drug use: Never   Sexual activity: Not on file  Other Topics Concern   Not on file  Social History Narrative   11/02/20 lives with daughter   Social Determinants of Health   Financial Resource Strain: Not on file  Food Insecurity: Not on file  Transportation Needs: Not on file  Physical Activity: Not on file   Stress: Not on file  Social Connections: Not on file     Family History: The patient's family history includes Colon cancer in his brother.  ROS:   Please see the history of present illness.     All other systems reviewed and are negative.  EKGs/Labs/Other Studies Reviewed:    The following studies were reviewed today: 2D echo 12/21/2021: 1. Left ventricular ejection fraction, by estimation, is 35 to 40%. The  left ventricle has moderately decreased function. The left ventricle  demonstrates global hypokinesis. There is mild concentric left ventricular  hypertrophy. Left ventricular  diastolic function could not be evaluated.   2. Right ventricular systolic function is moderately reduced. The right  ventricular size is mildly enlarged. There is moderately elevated  pulmonary artery systolic pressure. The estimated right ventricular  systolic pressure is 09.7 mmHg.   3. Left atrial size was severely dilated.   4. Right atrial size was severely dilated.   5. The mitral valve is grossly  normal. Trivial mitral valve  regurgitation. No evidence of mitral stenosis.   6. The tricuspid valve is abnormal. Tricuspid valve regurgitation is mild  to moderate.   7. The aortic valve is tricuspid. There is moderate calcification of the  aortic valve. There is moderate thickening of the aortic valve. Aortic  valve regurgitation is not visualized. Mild to moderate aortic valve  stenosis. Aortic valve area, by VTI  measures 1.65 cm. Aortic valve mean gradient measures 17.3 mmHg. Aortic  valve Vmax measures 2.96 m/s.   8. The inferior vena cava is normal in size with <50% respiratory  variability, suggesting right atrial pressure of 8 mmHg. __________   2D echo 07/26/2020: 1. Left ventricular ejection fraction, by estimation, is 20 to 25%. The  left ventricle has severely decreased function. The left ventricle  demonstrates global hypokinesis. There is mild concentric left ventricular   hypertrophy. Left ventricular diastolic   parameters are indeterminate. Elevated left atrial pressure.   2. Right ventricular systolic function is moderately reduced. The right  ventricular size is normal.   3. Left atrial size was severely dilated.   4. Right atrial size was moderately dilated.   5. The mitral valve is grossly normal. Moderate to severe mitral valve  regurgitation. Mechanism is likely atrial functional.   6. Tricuspid valve regurgitation is moderate to severe.   7. The aortic valve is calcified. There is moderate calcification of the  aortic valve. There is severe thickening of the aortic valve. Aortic valve  regurgitation is not visualized. At least mild aortic valve stenosis, as  we cannot exclude low flow low  gradient component.   8. The inferior vena cava is normal in size with greater than 50%  respiratory variability, suggesting right atrial pressure of 3 mmHg. __________   2D echo 09/24/2019 Jefm Bryant): AORTIC ROOT                   Size: Normal             Dissection: INDETERM FOR DISSECTION  AORTIC VALVE               Leaflets: Tricuspid                   Morphology: MILDLY THICKENED               Mobility: PARTIALLY MOBILE  LEFT VENTRICLE                   Size: MODERATELY ENLARGED           Anterior: HYPOCONTRACTILE            Contraction: MOD GLOBAL DECREASE            Lateral: HYPOCONTRACTILE             Closest EF: 30% (Estimated)                 Septal: HYPOCONTRACTILE              LV Masses: No Masses                       Apical: HYPOCONTRACTILE                    LVH: MILD LVH                      Inferior: HYPOCONTRACTILE  Posterior: HYPOCONTRACTILE           Dias.FxClass: N/A  MITRAL VALVE               Leaflets: Normal                        Mobility: Fully mobile             Morphology: Normal  LEFT ATRIUM                   Size: MODERATELY ENLARGED          LA Masses: No masses               IA Septum: Normal IAS  MAIN PA                   Size: Normal  PULMONIC VALVE             Morphology: Normal                        Mobility: Fully mobile  RIGHT VENTRICLE              RV Masses: No Masses                         Size: MILDLY ENLARGED              Free Wall: Normal                     Contraction: Normal  TRICUSPID VALVE               Leaflets: Normal                        Mobility: Fully mobile             Morphology: Normal  RIGHT ATRIUM                   Size: MODERATELY ENLARGED           RA Other: None                RA Mass: No masses  PERICARDIUM                  Fluid: No effusion                Pleural eff: SMALL PLEURAL EFFUSION  INFERIOR VENACAVA                   Size: DILATED Normal respiratory collapse  _________________________________________________________________________________________   DOPPLER ECHO and OTHER SPECIAL PROCEDURES                 Aortic: No AR                      MILD AS                         250.2 cm/sec peak vel      25.0 mmHg peak grad                         12.3 mmHg mean grad        1.2 cm^2 by DOPPLER  Mitral: SEVERE MR                  No MS                         MV Inflow E Vel = 96.0 cm/sec      MV Annulus E'Vel = nm*                         E/E'Ratio = nm*              Tricuspid: MODERATE TR                No TS                         366.5 cm/sec peak TR vel   58.9 mmHg peak RV pressure              Pulmonary: TRIVIAL PR                 No PS  _________________________________________________________________________________________  INTERPRETATION  MODERATE LV SYSTOLIC DYSFUNCTION (See above)   WITH MILD LVH  NORMAL RIGHT VENTRICULAR SYSTOLIC FUNCTION  SEVERE VALVULAR REGURGITATION (See above)  MILD VALVULAR STENOSIS (See above)   EKG:  EKG is ordered today.  The ekg ordered today demonstrates SB, 57bpm, nonspecific ST changes  Recent Labs: 12/21/2021: B Natriuretic Peptide  2,065.1 01/13/2022: ALT 53; Hemoglobin 11.2; Platelets 115 02/11/2022: BUN 41; Creatinine, Ser 1.16; Potassium 4.4; Sodium 140  Recent Lipid Panel    Component Value Date/Time   CHOL 115 02/11/2022 1540   TRIG 61 02/11/2022 1540   HDL 41 02/11/2022 1540   CHOLHDL 2.8 02/11/2022 1540   VLDL 12 02/11/2022 1540   LDLCALC 62 02/11/2022 1540   LDLDIRECT 60 02/11/2022 1540    Physical Exam:    VS:  BP 116/68 (BP Location: Right Arm, Patient Position: Sitting, Cuff Size: Normal)   Pulse 78   Ht '5\' 5"'$  (1.651 m)   Wt 111 lb (50.3 kg)   PF 98 L/min   BMI 18.47 kg/m     Wt Readings from Last 3 Encounters:  02/11/22 111 lb (50.3 kg)  01/13/22 117 lb (53.1 kg)  12/31/21 112 lb (50.8 kg)     GEN:  Well nourished, well developed in no acute distress HEENT: Normal NECK: No JVD; No carotid bruits LYMPHATICS: No lymphadenopathy CARDIAC: RRR, +murmur, no rubs, gallops RESPIRATORY:  Clear to auscultation without rales, wheezing or rhonchi  ABDOMEN: Soft, non-tender, non-distended MUSCULOSKELETAL:  No edema; No deformity  SKIN: Warm and dry NEUROLOGIC:  Alert and oriented x 3 PSYCHIATRIC:  Normal affect   ASSESSMENT:    1. Coronary artery disease involving native coronary artery of native heart without angina pectoris   2. HFrEF (heart failure with reduced ejection fraction) (HCC)   3. Persistent atrial fibrillation ([place])   4. Hyperlipidemia, unspecified hyperlipidemia type   5. Aortic valve stenosis, etiology of cardiac valve disease unspecified    PLAN:    In order of problems listed above:  CAD Patient denies chest pain. No ASA with Eliquis. Continue Toprol. The patient is wanting to stop statin, and wants to try red rice yeast. No further ischemic work-up as previously discussed.   HFrEF Most recent echo showed LVEF 35 to 40%. He is euvolemic on exam. Patient is on Toprol 25 mg daily. I will start [place]  10 mg daily.  I will decrease Lasix to 20 mg daily and potassium to 10  mEq daily. BMET in 1-2 weeks. Continue GDMT as able.   Persistent A-fib/flutter Patient is in atrial flutter with a rate of 78 bpm.  Continue Eliquis 2.5 mg twice daily for stroke prophylaxis. Continue BB for rate control.   Hyperlipidemia Family is wanting to stop Lipitor and use red yeast rice.  I will check cholesterol with direct LDL today.  We can recheck lipids in 6 to 8 weeks to see the difference in numbers.  Aortic stenosis Mild to moderate by echo with a mean gradient of 17.74mHg and a valve area of 1.65cm2. Previously not felt to be a candidate for invasive valve repair. Continue medical therapy.   Chronic hypoxic respiratory failure Patient is off supplemental O2.   Disposition: Follow up in 1 month(s) with MD/APP   Signed, [place] Virrueta HNinfa Meeker PA-C  02/13/2022 8:51 PM    [facility]

## 2022-02-11 NOTE — Patient Instructions (Addendum)
Medication Instructions:  START Farxiga 10 mg by mouth daily. STOP atorvastatin (Lipitor). DECREASE furosemide (Lasix) to 20 mg by mouth daily. DECREASE potassium to 10 mEq by mouth daily.  *If you need a refill on your cardiac medications before your next appointment, please call your pharmacy*  Lab Work: Lipid and direct LDL to be drawn today. BMP to be drawn in two weeks.  - Please go to the [facility]. You will check in at the front desk to the right as you walk into the atrium. Valet Parking is offered if needed. - No appointment needed. You may go any day between 7 am and 6 pm.  If you have labs (blood work) drawn today and your tests are completely normal, you will receive your results only by: [place] (if you have MyChart) OR A paper copy in the mail If you have any lab test that is abnormal or we need to change your treatment, we will call you to review the results.   Testing/Procedures: None ordered  Follow-Up: At [facility], you and your health needs are our priority.  As part of our continuing mission to provide you with exceptional heart care, we have created designated Provider Care Teams.  These Care Teams include your primary Cardiologist (physician) and Advanced Practice Providers (APPs -  Physician Assistants and Nurse Practitioners) who all work together to provide you with the care you need, when you need it.  We recommend signing up for the patient portal called "MyChart".  Sign up information is provided on this After Visit Summary.  MyChart is used to connect with patients for Virtual Visits (Telemedicine).  Patients are able to view lab/test results, encounter notes, upcoming appointments, etc.  Non-urgent messages can be sent to your provider as well.   To learn more about what you can do with MyChart, go to [email or web address].    Your next appointment:   1 month(s)  The format for your next appointment:   In Person  Provider:    You may see Skeet Latch, MD or one of the following Advanced Practice Providers on your designated Care Team:   Murray Hodgkins, NP Christell Faith, PA-C Cadence Kathlen Mody, PA-C Gerrie Nordmann, NP  Important Information About Sugar

## 2022-02-12 LAB — LDL CHOLESTEROL, DIRECT: Direct LDL: 60 mg/dL (ref 0–99)

## 2022-02-14 ENCOUNTER — Telehealth: Payer: Self-pay | Admitting: Medical

## 2022-02-14 MED ORDER — METOPROLOL SUCCINATE ER 25 MG PO TB24: 25.0000 mg | ORAL_TABLET | Freq: Every day | ORAL | 0 refills | Status: DC

## 2022-02-14 MED ORDER — FUROSEMIDE 20 MG PO TABS: 20.0000 mg | ORAL_TABLET | Freq: Two times a day (BID) | ORAL | 0 refills | Status: DC

## 2022-02-14 NOTE — Telephone Encounter (Signed)
Pt c/o medication issue:  1. Name of Medication: Farxiga  2. How are you currently taking this medication (dosage and times per day)?   3. Are you having a reaction (difficulty breathing--STAT)?  4. What is your medication issue? Can not afford it, it is too expensive

## 2022-02-14 NOTE — Telephone Encounter (Signed)
Spoke with the daughter. She stated that they cannot afford the [place]. Assistance papers have been mailed to her.   She would like to know if the patient should be taking the Furosemide 20 mg twice daily until he starts the [place] or take it once daily.

## 2022-02-14 NOTE — Telephone Encounter (Signed)
The daughter has been made aware. Refill sent in for her-Furosemide 20 mg bid  Furth, Cadence H, PA-C  You15 minutes ago (1:06 PM)    Sure that is fine to double lasix until they get [place]

## 2022-02-14 NOTE — Telephone Encounter (Signed)
Requested Prescriptions   Signed Prescriptions Disp Refills   metoprolol succinate (TOPROL-XL) 25 MG 24 hr tablet 90 tablet 0    Sig: Take 1 tablet (25 mg total) by mouth daily.    Authorizing Provider: Antony Madura    Ordering User: Raelene Bott, Maximos Zayas L

## 2022-02-14 NOTE — Telephone Encounter (Signed)
furosemide (LASIX) 20 MG tablet 90 tablet 0 02/11/2022 05/12/2022   Sig - Route: Take 1 tablet (20 mg total) by mouth daily. - Oral   Sent to pharmacy as: furosemide (LASIX) 20 MG tablet   E-Prescribing Status: Receipt confirmed by pharmacy (02/11/2022  3:14 PM EDT)    Pharmacy  [id number]- [place], [place]

## 2022-02-14 NOTE — Telephone Encounter (Signed)
Alejandro Moore is returning RN's call. Please advise.

## 2022-02-14 NOTE — Telephone Encounter (Signed)
*  STAT* If patient is at the pharmacy, call can be transferred to refill team.   1. Which medications need to be refilled? (please list name of each medication and dose if known)  Furosemide and Metoprolol  2. Which pharmacy/location (including street and city if local pharmacy) is medication to be sent to? [facility] [address] [place]  3. Do they need a 30 day or 90 day supply? 90 days and refills need this called in today- he is out of his Furosemide

## 2022-02-14 NOTE — Telephone Encounter (Signed)
Left a message for the patient's daughter to call back.  

## 2022-03-08 ENCOUNTER — Other Ambulatory Visit: Payer: Self-pay | Admitting: Medical

## 2022-03-10 NOTE — Progress Notes (Signed)
Cardiology Office Note    Date:  03/14/2022   ID:  Alejandro Moore, DOB [birth year], MRN [id number]  PCP:  Idelle Crouch, MD  Cardiologist:  Skeet Latch, MD  Electrophysiologist:  None   Chief Complaint: Follow-up  History of Present Illness:   Alejandro Moore is a 88 y.o. male with history of CAD, HFrEF, PAF/flutter, CVA, mitral regurgitation, aortic stenosis, aortic atherosclerosis, HLD, macrocytic anemia, and macular degeneration who presents for follow-up of CAD, cardiomyopathy, and A-fib/flutter.   He was previously followed by Dr. Nehemiah Massed, though has requested to transition his care to [facility] cardiology following his recent admission to [facility].   He was seen in the ED in 2021 for shortness of breath with bedside echo demonstrating an EF of 40%.  He declined admission.  He followed up with the [facility] clinic thereafter and was found to be in A-fib.  In follow-up with Dr. Nehemiah Massed, he was back in sinus rhythm.  Echo in 09/2019 showed an EF of 30%, mild LVH, severe mitral regurgitation, moderate tricuspid regurgitation, and mild aortic stenosis.  Prior outside office cardiology notes have stated "The patient has coronary artery disease previously diagnosed by imaging study years ago." CT abdomen/pelvis in 2021 showed coronary calcification of the LCx and RCA.  He has not had a prior stress test or cath.  He has been managed medically.  Echo from 07/2020 demonstrated an EF of 20 to 25%, global hypokinesis, mild concentric LVH, indeterminate LV diastolic function parameters, moderately reduced RV systolic function with normal ventricular cavity size, severely dilated left atrium, moderately dilated right atrium, moderate to severe mitral regurgitation, moderate to severe tricuspid regurgitation, calcified aortic valve with at least mild stenosis (unable to exclude low-flow low gradient component), and an estimated right atrial pressure of 3 mmHg.   He was admitted to  [facility] in 12/2021 after sustaining a fall at home, without LOC and AMS, complicated by acute on chronic HFrEF, AKI, and elevated troponin.  BNP 2065.  High-sensitivity troponin peaked at 208.  Chest x-ray with mild to moderate pulmonary edema with small bilateral pleural effusions and chronic clavicular injury.  Pelvic plain film raised a question of left femoral neck fracture.  CT of the pelvis showed no displaced fracture or dislocation.  CT head/C-spine showed no acute intracranial abnormality with moderate extensive small vessel disease and severe cerebellar atrophy.  Echo during the admission demonstrated an EF of 35 to 40%, global hypokinesis, mild concentric LVH, indeterminate LV diastolic function parameters, moderately reduced RV systolic function with mildly enlarged ventricular cavity size, moderately elevated PASP estimated at 52.1 mmHg, severe biatrial enlargement, trivial mitral regurgitation, mild to moderate tricuspid regurgitation, tricuspid aortic valve with mild to moderate stenosis with a mean gradient of 17.3 mmHg and a valve area of 1.65 cm, and an estimated right atrial pressure of 8 mmHg.  Initially, he was diuresed with noted AKI, however with worsening hypoxic respiratory failure he was rechallenged with IV Lasix with good diuresis and improvement in dyspnea.  He was not felt to be a candidate for invasive work-up with regards to his elevated troponin or valvular heart disease.  He was seen in hospital follow-up on 12/31/2021 and was doing reasonably well, residing in a nursing home.  He was without symptoms of angina or decompensation.  Lower extremity swelling had resolved.  Continued medical therapy was recommended.  He was last seen in the office on 02/11/2022 and was off supplemental oxygen.  Patient and family  preferred to discontinue atorvastatin.  He was started on Farxiga at that time with continuation of furosemide 20 mg twice daily and Toprol-XL.  He comes in  today accompanied by his son and daughter and is doing well from a cardiac perspective.  They did not start [place] following their last visit.  He has been without symptoms of angina or decompensation.  No further falls.  No dizziness, presyncope, or syncope.  No symptoms concerning for bleeding.  He continues to have a good appetite.  No progressive orthopnea or early satiety.  There is some mild lower extremity swelling.  Overall the patient and his family feel like the patient is doing well from a cardiac perspective.   Labs independently reviewed: 02/2022 - TC 115, TG 61, HDL 41, LDL 62, potassium 4.4, BUN 41, serum creatinine 1.16 01/2022 - Hgb 11.2, PLT 115, albumin 2.5, AST 51, ALT 53 07/2020 - A1c 6.0, TSH normal  Past Medical History:  Diagnosis Date   Actinic keratosis    Aortic atherosclerosis (HCC)    Aortic stenosis    BPH (benign prostatic hyperplasia)    Cataract 11/30/2021   Left Eye   Chronic HFrEF (heart failure with reduced ejection fraction) (HCC)    Chronic kidney disease, stage 3a (HCC)    Coronary artery calcification seen on CT scan    CVA (cerebral vascular accident) ([place])    Macular degeneration    Mitral regurgitation    PAF (paroxysmal atrial fibrillation) (HCC)    Tricuspid regurgitation     Past Surgical History:  Procedure Laterality Date   KNEE SURGERY     SHOULDER SURGERY      Current Medications: Current Meds  Medication Sig   apixaban (ELIQUIS) 2.5 MG TABS tablet Take 1 tablet (2.5 mg total) by mouth 2 (two) times daily.   esomeprazole (NEXIUM) 20 MG capsule Take 20 mg by mouth every other day.   furosemide (LASIX) 20 MG tablet TAKE 1 TABLET BY MOUTH TWICE A DAY   metoprolol succinate (TOPROL-XL) 25 MG 24 hr tablet Take 1 tablet (25 mg total) by mouth daily.   Multiple Vitamins-Minerals (PRESERVISION AREDS 2) CAPS Take 1 capsule by mouth 2 (two) times daily.   Omega-3 Fatty Acids (FISH OIL OMEGA-3 PO) Take by mouth daily.   OVER THE COUNTER  MEDICATION Take 1 packet by mouth every morning. Multivitamin pak - [employer]   oxymetazoline (AFRIN) 0.05 % nasal spray Place 1 spray into both nostrils 2 (two) times daily as needed (nose bleeds).   potassium chloride (KLOR-CON) 10 MEQ tablet Take 1 tablet (10 mEq total) by mouth daily.   tamsulosin (FLOMAX) 0.4 MG CAPS capsule Take 0.4 mg by mouth daily after supper.   vitamin B-12 1000 MCG tablet Take 1 tablet (1,000 mcg total) by mouth daily.    Allergies:   Patient has no known allergies.   Social History   Socioeconomic History   Marital status: Widowed    Spouse name: Not on file   Number of children: 2   Years of education: Not on file   Highest education level: Associate degree: occupational, Hotel manager, or vocational program  Occupational History   Not on file  Tobacco Use   Smoking status: Never   Smokeless tobacco: Never  Vaping Use   Vaping Use: Never used  Substance and Sexual Activity   Alcohol use: Never   Drug use: Never   Sexual activity: Not on file  Other Topics Concern   Not  on file  Social History Narrative   11/02/20 lives with daughter   Social Determinants of Health   Financial Resource Strain: Not on file  Food Insecurity: Not on file  Transportation Needs: Not on file  Physical Activity: Not on file  Stress: Not on file  Social Connections: Not on file     Family History:  The patient's family history includes Colon cancer in his brother.  ROS:   12-point review of systems is negative unless otherwise noted in HPI.   EKGs/Labs/Other Studies Reviewed:    Studies reviewed were summarized above. The additional studies were reviewed today:  2D echo 12/21/2021: 1. Left ventricular ejection fraction, by estimation, is 35 to 40%. The  left ventricle has moderately decreased function. The left ventricle  demonstrates global hypokinesis. There is mild concentric left ventricular  hypertrophy. Left ventricular  diastolic  function could not be evaluated.   2. Right ventricular systolic function is moderately reduced. The right  ventricular size is mildly enlarged. There is moderately elevated  pulmonary artery systolic pressure. The estimated right ventricular  systolic pressure is 34.1 mmHg.   3. Left atrial size was severely dilated.   4. Right atrial size was severely dilated.   5. The mitral valve is grossly normal. Trivial mitral valve  regurgitation. No evidence of mitral stenosis.   6. The tricuspid valve is abnormal. Tricuspid valve regurgitation is mild  to moderate.   7. The aortic valve is tricuspid. There is moderate calcification of the  aortic valve. There is moderate thickening of the aortic valve. Aortic  valve regurgitation is not visualized. Mild to moderate aortic valve  stenosis. Aortic valve area, by VTI  measures 1.65 cm. Aortic valve mean gradient measures 17.3 mmHg. Aortic  valve Vmax measures 2.96 m/s.   8. The inferior vena cava is normal in size with <50% respiratory  variability, suggesting right atrial pressure of 8 mmHg. __________   2D echo 07/26/2020: 1. Left ventricular ejection fraction, by estimation, is 20 to 25%. The  left ventricle has severely decreased function. The left ventricle  demonstrates global hypokinesis. There is mild concentric left ventricular  hypertrophy. Left ventricular diastolic   parameters are indeterminate. Elevated left atrial pressure.   2. Right ventricular systolic function is moderately reduced. The right  ventricular size is normal.   3. Left atrial size was severely dilated.   4. Right atrial size was moderately dilated.   5. The mitral valve is grossly normal. Moderate to severe mitral valve  regurgitation. Mechanism is likely atrial functional.   6. Tricuspid valve regurgitation is moderate to severe.   7. The aortic valve is calcified. There is moderate calcification of the  aortic valve. There is severe thickening of the aortic  valve. Aortic valve  regurgitation is not visualized. At least mild aortic valve stenosis, as  we cannot exclude low flow low  gradient component.   8. The inferior vena cava is normal in size with greater than 50%  respiratory variability, suggesting right atrial pressure of 3 mmHg. __________   2D echo 09/24/2019 Jefm Bryant): AORTIC ROOT                   Size: Normal             Dissection: INDETERM FOR DISSECTION  AORTIC VALVE               Leaflets: Tricuspid  Morphology: MILDLY THICKENED               Mobility: PARTIALLY MOBILE  LEFT VENTRICLE                   Size: MODERATELY ENLARGED           Anterior: HYPOCONTRACTILE            Contraction: MOD GLOBAL DECREASE            Lateral: HYPOCONTRACTILE             Closest EF: 30% (Estimated)                 Septal: HYPOCONTRACTILE              LV Masses: No Masses                       Apical: HYPOCONTRACTILE                    LVH: MILD LVH                      Inferior: HYPOCONTRACTILE                                                      Posterior: HYPOCONTRACTILE           Dias.FxClass: N/A  MITRAL VALVE               Leaflets: Normal                        Mobility: Fully mobile             Morphology: Normal  LEFT ATRIUM                   Size: MODERATELY ENLARGED          LA Masses: No masses              IA Septum: Normal IAS  MAIN PA                   Size: Normal  PULMONIC VALVE             Morphology: Normal                        Mobility: Fully mobile  RIGHT VENTRICLE              RV Masses: No Masses                         Size: MILDLY ENLARGED              Free Wall: Normal                     Contraction: Normal  TRICUSPID VALVE               Leaflets: Normal                        Mobility: Fully mobile             Morphology: Normal  RIGHT ATRIUM  Size: MODERATELY ENLARGED           RA Other: None                RA Mass: No masses  PERICARDIUM                  Fluid: No  effusion                Pleural eff: SMALL PLEURAL EFFUSION  INFERIOR VENACAVA                   Size: DILATED Normal respiratory collapse  _________________________________________________________________________________________   DOPPLER ECHO and OTHER SPECIAL PROCEDURES                 Aortic: No AR                      MILD AS                         250.2 cm/sec peak vel      25.0 mmHg peak grad                         12.3 mmHg mean grad        1.2 cm^2 by DOPPLER                 Mitral: SEVERE MR                  No MS                         MV Inflow E Vel = 96.0 cm/sec      MV Annulus E'Vel = nm*                         E/E'Ratio = nm*              Tricuspid: MODERATE TR                No TS                         366.5 cm/sec peak TR vel   58.9 mmHg peak RV pressure              Pulmonary: TRIVIAL PR                 No PS  _________________________________________________________________________________________  INTERPRETATION  MODERATE LV SYSTOLIC DYSFUNCTION (See above)   WITH MILD LVH  NORMAL RIGHT VENTRICULAR SYSTOLIC FUNCTION  SEVERE VALVULAR REGURGITATION (See above)  MILD VALVULAR STENOSIS (See above)    EKG:  EKG is ordered today.  The EKG ordered today demonstrates atrial flutter with 4:1 conduction versus coarse A-fib, 79 bpm, poor R wave progression along the precordial leads, nonspecific ST-T changes  Recent Labs: 12/21/2021: B Natriuretic Peptide 2,065.1 01/13/2022: ALT 53; Hemoglobin 11.2; Platelets 115 02/11/2022: BUN 41; Creatinine, Ser 1.16; Potassium 4.4; Sodium 140  Recent Lipid Panel    Component Value Date/Time   CHOL 115 02/11/2022 1540   TRIG 61 02/11/2022 1540   HDL 41 02/11/2022 1540   CHOLHDL 2.8 02/11/2022 1540   VLDL 12 02/11/2022 1540   LDLCALC 62 02/11/2022 1540   LDLDIRECT 60 02/11/2022 1540    PHYSICAL EXAM:    VS:  BP 117/72 (  BP Location: Left Arm, Patient Position: Sitting, Cuff Size: Normal)   Pulse 79   Ht _0  (1.651 m)   Wt  116 lb 12.8 oz (53 kg)   SpO2 99%   BMI 19.44 kg/m   BMI: Body mass index is 19.44 kg/m.  Physical Exam Vitals reviewed.  Constitutional:      Appearance: He is well-developed.  HENT:     Head: Normocephalic and atraumatic.  Eyes:     General:        Right eye: No discharge.        Left eye: No discharge.  Neck:     Vascular: No JVD.  Cardiovascular:     Rate and Rhythm: Normal rate. Rhythm regularly irregular.     Pulses:          Posterior tibial pulses are 2+ on the right side and 2+ on the left side.     Heart sounds: S1 normal and S2 normal. Heart sounds not distant. No midsystolic click and no opening snap. Murmur heard.     Systolic murmur is present with a grade of 1/6 at the upper right sternal border.     Systolic murmur of grade 1/6 is also present at the lower left sternal border.     No friction rub.  Pulmonary:     Effort: Pulmonary effort is normal. No respiratory distress.     Breath sounds: Normal breath sounds. No decreased breath sounds, wheezing or rales.  Chest:     Chest wall: No tenderness.  Abdominal:     General: There is no distension.     Palpations: Abdomen is soft.     Tenderness: There is no abdominal tenderness.  Musculoskeletal:     Cervical back: Normal range of motion.     Comments: Trivial bilateral pretibial edema.  Skin:    General: Skin is warm and dry.     Nails: There is no clubbing.  Neurological:     Mental Status: He is alert and oriented to person, place, and time.  Psychiatric:        Speech: Speech normal.        Behavior: Behavior normal.        Thought Content: Thought content normal.        Judgment: Judgment normal.     Wt Readings from Last 3 Encounters:  03/14/22 116 lb 12.8 oz (53 kg)  02/11/22 111 lb (50.3 kg)  01/13/22 117 lb (53.1 kg)     ASSESSMENT & PLAN:   CAD involving the native coronary arteries without angina/atherosclerosis/HLD: He is doing well, and is without symptoms of angina or  decompensation.  Functional status continues to improve.  Much more alert today than when I first met him in 12/2021.  Has previously not been felt to be a candidate for invasive workup given advanced age and comorbid conditions.  In this pain, and in the context of him being asymptomatic, no indication for further cardiac testing at this time.  Continue risk factor modification and current medical therapy including apixaban in place of aspirin, given underlying A-fib, to minimize risk of bleeding, along with Toprol-XL.  No longer on atorvastatin at family request, which is reasonable given his advanced age.  HFrEF: He appears euvolemic and well compensated.  He did not start [place] following his last visit, which I agree with as the risk of off target effects outweighed potential benefit of SGLT2 inhibitor in this clinical context.  Not currently on ACE  inhibitor/ARB/ARNI/MRA in an effort to minimize significant hypotension and fall risk.  He remains on low-dose furosemide and Toprol-XL.  Persistent A-fib/flutter: Asymptomatic and rate controlled with Toprol-XL.  CHA2DS2-VASc at least 6 (CHF, age x 2, CVA x 2, vascular disease).  He remains on renally dosed apixaban (age and weight) and is without symptoms concerning for bleeding.  Recent renal function, electrolytes, and Hgb stable.  Mitral regurgitation: Trivial by most recent echo.  Prior echo in 07/2020 was notable for moderate to severe mitral regurgitation.  Previously felt to not be a candidate for TEE or invasive valve repair given advanced age and comorbid conditions.  Continue medical therapy.  Aortic stenosis: Cannot exclude some degree of low-flow low gradient aortic stenosis with underlying cardiomyopathy.  Macrocytic anemia: Hemoglobin stable dating back to mid 12/2021.  Chronic hypoxic respiratory failure: No longer requiring supplemental oxygen.    Disposition: F/u with Dr. Saunders Revel or an APP in 6 months.   Medication Adjustments/Labs  and Tests Ordered: Current medicines are reviewed at length with the patient today.  Concerns regarding medicines are outlined above. Medication changes, Labs and Tests ordered today are summarized above and listed in the Patient Instructions accessible in Encounters.   Signed, Christell Faith, PA-C 03/14/2022 4:43 PM     [facility] [address] [place] [address] [place], [place] [address] [phone]

## 2022-03-14 ENCOUNTER — Encounter: Payer: Self-pay | Admitting: Physician Assistant

## 2022-03-14 ENCOUNTER — Ambulatory Visit: Payer: Medicare Other | Attending: Internal Medicine | Admitting: Physician Assistant

## 2022-03-14 VITALS — BP 117/72 | HR 79 | Ht 65.0 in | Wt 116.8 lb

## 2022-03-14 DIAGNOSIS — I4819 Other persistent atrial fibrillation: Secondary | ICD-10-CM | POA: Diagnosis present

## 2022-03-14 DIAGNOSIS — D539 Nutritional anemia, unspecified: Secondary | ICD-10-CM | POA: Insufficient documentation

## 2022-03-14 DIAGNOSIS — I350 Nonrheumatic aortic (valve) stenosis: Secondary | ICD-10-CM | POA: Insufficient documentation

## 2022-03-14 DIAGNOSIS — I5020 Unspecified systolic (congestive) heart failure: Secondary | ICD-10-CM | POA: Insufficient documentation

## 2022-03-14 DIAGNOSIS — I2510 Atherosclerotic heart disease of native coronary artery without angina pectoris: Secondary | ICD-10-CM | POA: Insufficient documentation

## 2022-03-14 DIAGNOSIS — I340 Nonrheumatic mitral (valve) insufficiency: Secondary | ICD-10-CM | POA: Diagnosis present

## 2022-03-14 NOTE — Patient Instructions (Signed)
Medication Instructions:  Your physician has recommended you make the following change in your medication:   STOP Wilder Glade  *If you need a refill on your cardiac medications before your next appointment, please call your pharmacy*   Lab Work: None  If you have labs (blood work) drawn today and your tests are completely normal, you will receive your results only by: [facility] (if you have MyChart) OR A paper copy in the mail If you have any lab test that is abnormal or we need to change your treatment, we will call you to review the results.   Testing/Procedures: None   Follow-Up: At [facility], you and your health needs are our priority.  As part of our continuing mission to provide you with exceptional heart care, we have created designated Provider Care Teams.  These Care Teams include your primary Cardiologist (physician) and Advanced Practice Providers (APPs -  Physician Assistants and Nurse Practitioners) who all work together to provide you with the care you need, when you need it.   Your next appointment:   6 month(s)  The format for your next appointment:   In Person  Provider:   You may see Skeet Latch, MD or one of the following Advanced Practice Providers on your designated Care Team:   Murray Hodgkins, NP Christell Faith, PA-C Cadence Kathlen Mody, PA-C Gerrie Nordmann, NP      Important Information About Sugar

## 2022-03-17 ENCOUNTER — Ambulatory Visit: Payer: Medicare Other | Admitting: Internal Medicine

## 2022-03-25 ENCOUNTER — Ambulatory Visit: Payer: Medicare Other | Admitting: Internal Medicine

## 2022-05-10 ENCOUNTER — Other Ambulatory Visit: Payer: Self-pay | Admitting: Medical

## 2022-05-11 ENCOUNTER — Other Ambulatory Visit: Payer: Self-pay | Admitting: Medical

## 2022-06-13 ENCOUNTER — Other Ambulatory Visit (HOSPITAL_BASED_OUTPATIENT_CLINIC_OR_DEPARTMENT_OTHER): Payer: Self-pay | Admitting: *Deleted

## 2022-06-13 MED ORDER — METOPROLOL SUCCINATE ER 25 MG PO TB24: 25.0000 mg | ORAL_TABLET | Freq: Every day | ORAL | 1 refills | Status: DC

## 2022-07-11 ENCOUNTER — Ambulatory Visit: Payer: Medicare Other | Admitting: Dermatology

## 2022-07-13 ENCOUNTER — Ambulatory Visit: Payer: Medicare Other | Admitting: Dermatology

## 2022-09-22 NOTE — Progress Notes (Signed)
Cardiology Office Note    Date:  09/23/2022   ID:  Riaan Skokan, DOB 1919/06/14, MRN [id number]  PCP:  Marguarite Arbour, MD  Cardiologist:  Chilton Si, MD  Electrophysiologist:  None   Chief Complaint: Follow up  History of Present Illness:   Alejandro Moore is a 89 y.o. male with history of CAD, HFrEF, PAF/flutter, CVA, mitral regurgitation, aortic stenosis, aortic atherosclerosis, HLD, macrocytic anemia, and macular degeneration who presents for follow-up of CAD, cardiomyopathy, and A-fib/flutter.   He was previously followed by Dr. Gwen Pounds, though has requested to transition his care to [facility] cardiology following his recent admission to [facility].   He was seen in the ED in 2021 for shortness of breath with bedside echo demonstrating an EF of 40%.  He declined admission.  He followed up with the [facility] CHF clinic thereafter and was found to be in A-fib.  In follow-up with Dr. Gwen Pounds, he was back in sinus rhythm.  Echo in 09/2019 showed an EF of 30%, mild LVH, severe mitral regurgitation, moderate tricuspid regurgitation, and mild aortic stenosis.  Prior outside office cardiology notes have stated "The patient has coronary artery disease previously diagnosed by imaging study years ago." CT abdomen/pelvis in 2021 showed coronary calcification of the LCx and RCA.  He has not had a prior stress test or cath.  He has been managed medically.  Echo from 07/2020 demonstrated an EF of 20 to 25%, global hypokinesis, mild concentric LVH, indeterminate LV diastolic function parameters, moderately reduced RV systolic function with normal ventricular cavity size, severely dilated left atrium, moderately dilated right atrium, moderate to severe mitral regurgitation, moderate to severe tricuspid regurgitation, calcified aortic valve with at least mild stenosis (unable to exclude low-flow low gradient component), and an estimated right atrial pressure of 3 mmHg.   He was admitted to  [facility] in 12/2021 after sustaining a fall at home, without LOC and AMS, complicated by acute on chronic HFrEF, AKI, and elevated troponin.  BNP 2065.  High-sensitivity troponin peaked at 208.  Chest x-ray with mild to moderate pulmonary edema with small bilateral pleural effusions and chronic clavicular injury.  Pelvic plain film raised a question of left femoral neck fracture.  CT of the pelvis showed no displaced fracture or dislocation.  CT head/C-spine showed no acute intracranial abnormality with moderate extensive small vessel disease and severe cerebellar atrophy.  Echo during the admission demonstrated an EF of 35 to 40%, global hypokinesis, mild concentric LVH, indeterminate LV diastolic function parameters, moderately reduced RV systolic function with mildly enlarged ventricular cavity size, moderately elevated PASP estimated at 52.1 mmHg, severe biatrial enlargement, trivial mitral regurgitation, mild to moderate tricuspid regurgitation, tricuspid aortic valve with mild to moderate stenosis with a mean gradient of 17.3 mmHg and a valve area of 1.65 cm, and an estimated right atrial pressure of 8 mmHg.  Initially, he was diuresed with noted AKI, however with worsening hypoxic respiratory failure he was rechallenged with IV Lasix with good diuresis and improvement in dyspnea.  He was not felt to be a candidate for invasive work-up with regards to his elevated troponin or valvular heart disease.   He was seen in hospital follow-up on 12/31/2021 and was doing reasonably well, residing in a nursing home.  He was without symptoms of angina or decompensation.  Lower extremity swelling had resolved.  Continued medical therapy was recommended.   He was seen in the office on 02/11/2022 by one of our other providers, and  was off supplemental oxygen.  Patient and family preferred to discontinue atorvastatin.  He was started on Farxiga at that time with continuation of furosemide 20 mg twice daily and  Toprol-XL.  He was last seen in the office in 03/2022 and remained without symptoms of angina or cardiac decompensation.  He had not started [place], which we did not pursue further as it was felt the risks outweigh the benefits in this clinical setting.  He was no longer requiring supplemental oxygen.  Functional status was improving.  He comes in today accompanied by his family and is without symptoms of angina or cardiac decompensation.  He did recently hit the backside of his right hand on the corner of a dresser causing a skin tear.  Upon picking at the scab he experienced increased bleeding requiring medical evaluation.  He has been placed on antibiotics and was given a splint in an effort to immobilize the and to prevent reopening of skin tear.  No further bleeding from wound.  He denies dyspnea, palpitations, dizziness, presyncope, or syncope.  The family reports he does sit with his feet hanging down for most of the day.  Does not regularly elevate his legs.  Not wearing compression socks.  They do feel like his lower extremities are a little more swollen.  Weight has been stable at home around 120 pounds.  No falls, hematochezia, or melena.  Remains on furosemide 20 mg twice daily.  They also notes some intermittent epistaxis for which she has previously been evaluated by ENT and required cauterization.   Labs independently reviewed: 06/2022 - potassium 4.6, BUN 31, serum creatinine 1.1, albumin 3.3, Hgb 11.9, PLT 143, AST/ALT normal 02/2022 - TC 115, TG 61, HDL 41, LDL 62 07/2020 - A1c 6.0, TSH normal  Past Medical History:  Diagnosis Date   Actinic keratosis    Aortic atherosclerosis (HCC)    Aortic stenosis    BPH (benign prostatic hyperplasia)    Cataract 11/30/2021   Left Eye   Chronic HFrEF (heart failure with reduced ejection fraction) (HCC)    Chronic kidney disease, stage 3a (HCC)    Coronary artery calcification seen on CT scan    CVA (cerebral vascular accident) (HCC)     Macular degeneration    Mitral regurgitation    PAF (paroxysmal atrial fibrillation) (HCC)    Tricuspid regurgitation     Past Surgical History:  Procedure Laterality Date   KNEE SURGERY     SHOULDER SURGERY      Current Medications: Current Meds  Medication Sig   apixaban (ELIQUIS) 2.5 MG TABS tablet Take 1 tablet (2.5 mg total) by mouth 2 (two) times daily.   esomeprazole (NEXIUM) 20 MG capsule Take 20 mg by mouth every other day.   furosemide (LASIX) 20 MG tablet TAKE 1 TABLET BY MOUTH TWICE A DAY   metoprolol succinate (TOPROL-XL) 25 MG 24 hr tablet Take 1 tablet (25 mg total) by mouth daily.   Multiple Vitamins-Minerals (PRESERVISION AREDS 2) CAPS Take 1 capsule by mouth 2 (two) times daily.   Omega-3 Fatty Acids (FISH OIL OMEGA-3 PO) Take by mouth daily.   OVER THE COUNTER MEDICATION Take 1 packet by mouth every morning. Multivitamin pak - [facility]   tamsulosin (FLOMAX) 0.4 MG CAPS capsule Take 0.4 mg by mouth daily after supper.    Allergies:   Patient has no known allergies.   Social History   Socioeconomic History   Marital status: Widowed    Spouse  name: Not on file   Number of children: 2   Years of education: Not on file   Highest education level: Associate degree: occupational, Scientist, product/process development, or vocational program  Occupational History   Not on file  Tobacco Use   Smoking status: Never   Smokeless tobacco: Never  Vaping Use   Vaping Use: Never used  Substance and Sexual Activity   Alcohol use: Never   Drug use: Never   Sexual activity: Not on file  Other Topics Concern   Not on file  Social History Narrative   11/02/20 lives with daughter   Social Determinants of Health   Financial Resource Strain: Not on file  Food Insecurity: Not on file  Transportation Needs: Not on file  Physical Activity: Not on file  Stress: Not on file  Social Connections: Not on file     Family History:  The patient's family history includes Colon  cancer in his brother.  ROS:   12-point review of systems is negative unless otherwise noted in the HPI.   EKGs/Labs/Other Studies Reviewed:    Studies reviewed were summarized above. The additional studies were reviewed today:  2D echo 12/21/2021: 1. Left ventricular ejection fraction, by estimation, is 35 to 40%. The  left ventricle has moderately decreased function. The left ventricle  demonstrates global hypokinesis. There is mild concentric left ventricular  hypertrophy. Left ventricular  diastolic function could not be evaluated.   2. Right ventricular systolic function is moderately reduced. The right  ventricular size is mildly enlarged. There is moderately elevated  pulmonary artery systolic pressure. The estimated right ventricular  systolic pressure is 52.1 mmHg.   3. Left atrial size was severely dilated.   4. Right atrial size was severely dilated.   5. The mitral valve is grossly normal. Trivial mitral valve  regurgitation. No evidence of mitral stenosis.   6. The tricuspid valve is abnormal. Tricuspid valve regurgitation is mild  to moderate.   7. The aortic valve is tricuspid. There is moderate calcification of the  aortic valve. There is moderate thickening of the aortic valve. Aortic  valve regurgitation is not visualized. Mild to moderate aortic valve  stenosis. Aortic valve area, by VTI  measures 1.65 cm. Aortic valve mean gradient measures 17.3 mmHg. Aortic  valve Vmax measures 2.96 m/s.   8. The inferior vena cava is normal in size with <50% respiratory  variability, suggesting right atrial pressure of 8 mmHg. __________   2D echo 07/26/2020: 1. Left ventricular ejection fraction, by estimation, is 20 to 25%. The  left ventricle has severely decreased function. The left ventricle  demonstrates global hypokinesis. There is mild concentric left ventricular  hypertrophy. Left ventricular diastolic   parameters are indeterminate. Elevated left atrial  pressure.   2. Right ventricular systolic function is moderately reduced. The right  ventricular size is normal.   3. Left atrial size was severely dilated.   4. Right atrial size was moderately dilated.   5. The mitral valve is grossly normal. Moderate to severe mitral valve  regurgitation. Mechanism is likely atrial functional.   6. Tricuspid valve regurgitation is moderate to severe.   7. The aortic valve is calcified. There is moderate calcification of the  aortic valve. There is severe thickening of the aortic valve. Aortic valve  regurgitation is not visualized. At least mild aortic valve stenosis, as  we cannot exclude low flow low  gradient component.   8. The inferior vena cava is normal in size with greater  than 50%  respiratory variability, suggesting right atrial pressure of 3 mmHg. __________   2D echo 09/24/2019 Gavin Potters): AORTIC ROOT                   Size: Normal             Dissection: INDETERM FOR DISSECTION  AORTIC VALVE               Leaflets: Tricuspid                   Morphology: MILDLY THICKENED               Mobility: PARTIALLY MOBILE  LEFT VENTRICLE                   Size: MODERATELY ENLARGED           Anterior: HYPOCONTRACTILE            Contraction: MOD GLOBAL DECREASE            Lateral: HYPOCONTRACTILE             Closest EF: 30% (Estimated)                 Septal: HYPOCONTRACTILE              LV Masses: No Masses                       Apical: HYPOCONTRACTILE                    LVH: MILD LVH                      Inferior: HYPOCONTRACTILE                                                      Posterior: HYPOCONTRACTILE           Dias.FxClass: N/A  MITRAL VALVE               Leaflets: Normal                        Mobility: Fully mobile             Morphology: Normal  LEFT ATRIUM                   Size: MODERATELY ENLARGED          LA Masses: No masses              IA Septum: Normal IAS  MAIN PA                   Size: Normal  PULMONIC VALVE              Morphology: Normal                        Mobility: Fully mobile  RIGHT VENTRICLE              RV Masses: No Masses                         Size: MILDLY ENLARGED  Free Wall: Normal                     Contraction: Normal  TRICUSPID VALVE               Leaflets: Normal                        Mobility: Fully mobile             Morphology: Normal  RIGHT ATRIUM                   Size: MODERATELY ENLARGED           RA Other: None                RA Mass: No masses  PERICARDIUM                  Fluid: No effusion                Pleural eff: SMALL PLEURAL EFFUSION  INFERIOR VENACAVA                   Size: DILATED Normal respiratory collapse  _________________________________________________________________________________________   DOPPLER ECHO and OTHER SPECIAL PROCEDURES                 Aortic: No AR                      MILD AS                         250.2 cm/sec peak vel      25.0 mmHg peak grad                         12.3 mmHg mean grad        1.2 cm^2 by DOPPLER                 Mitral: SEVERE MR                  No MS                         MV Inflow E Vel = 96.0 cm/sec      MV Annulus E'Vel = nm*                         E/E'Ratio = nm*              Tricuspid: MODERATE TR                No TS                         366.5 cm/sec peak TR vel   58.9 mmHg peak RV pressure              Pulmonary: TRIVIAL PR                 No PS  _________________________________________________________________________________________  INTERPRETATION  MODERATE LV SYSTOLIC DYSFUNCTION (See above)   WITH MILD LVH  NORMAL RIGHT VENTRICULAR SYSTOLIC FUNCTION  SEVERE VALVULAR REGURGITATION (See above)  MILD VALVULAR STENOSIS (See above)    EKG:  EKG is ordered today.  The EKG ordered today demonstrates atrial flutter with 4:1 AV block,  79 bpm, left intrafascicular, LVH, lateral T wave inversion, poor R wave progression along the precordial leads, consistent with prior tracing  Recent  Labs: 12/21/2021: B Natriuretic Peptide 2,065.1 01/13/2022: ALT 53 09/23/2022: BUN 38; Creatinine, Ser 1.00; Hemoglobin 10.7; Platelets 114; Potassium 4.6; Sodium 141  Recent Lipid Panel    Component Value Date/Time   CHOL 115 02/11/2022 1540   TRIG 61 02/11/2022 1540   HDL 41 02/11/2022 1540   CHOLHDL 2.8 02/11/2022 1540   VLDL 12 02/11/2022 1540   LDLCALC 62 02/11/2022 1540   LDLDIRECT 60 02/11/2022 1540    PHYSICAL EXAM:    VS:  BP 118/70   Pulse 79   Ht 5\' 5"  (1.651 m)   Wt 121 lb (54.9 kg)   SpO2 94%   BMI 20.14 kg/m   BMI: Body mass index is 20.14 kg/m.  Physical Exam Vitals reviewed. Nursing note reviewed: Elderly and frail-appearing.. Constitutional:      Appearance: He is well-developed.  HENT:     Head: Normocephalic and atraumatic.  Eyes:     General:        Right eye: No discharge.        Left eye: No discharge.  Neck:     Vascular: No JVD.  Cardiovascular:     Rate and Rhythm: Normal rate. Rhythm regularly irregular.     Heart sounds: S1 normal and S2 normal. Heart sounds not distant. No midsystolic click and no opening snap. Murmur heard.     Systolic murmur is present with a grade of 1/6 at the upper right sternal border.     Systolic murmur of grade 1/6 is also present at the lower left sternal border.     No friction rub.  Pulmonary:     Effort: Pulmonary effort is normal. No respiratory distress.     Breath sounds: Normal breath sounds. No decreased breath sounds, wheezing or rales.  Chest:     Chest wall: No tenderness.  Abdominal:     General: There is no distension.  Musculoskeletal:     Cervical back: Normal range of motion.     Right lower leg: Edema present.     Left lower leg: Edema present.  Skin:    General: Skin is warm and dry.     Nails: There is no clubbing.  Neurological:     Mental Status: He is alert and oriented to person, place, and time.  Psychiatric:        Speech: Speech normal.        Behavior: Behavior normal.         Thought Content: Thought content normal.        Judgment: Judgment normal.     Wt Readings from Last 3 Encounters:  09/23/22 121 lb (54.9 kg)  03/14/22 116 lb 12.8 oz (53 kg)  02/11/22 111 lb (50.3 kg)     ASSESSMENT & PLAN:   CAD involving the native coronary arteries without angina/atherosclerosis/HLD: He is without symptoms of angina or cardiac decompensation.  Functional status is stable.  Previously not felt to be a candidate for invasive workup given advanced age and comorbid conditions.  In this setting, and in the context of him being asymptomatic, no indication for further cardiac testing at this time.  Continue aggressive risk factor modification and current medical therapy including apixaban in place of aspirin, given underlying A-fib/flutter to minimize bleeding risk, along with Toprol-XL.  No longer on statin therapy at family request, which is reasonable given  his advanced age.  HFrEF: He does not.  He is retaining some fluid with increased lower extremity swelling and a weight that is up 5 pounds by our scale when compared to his visit in 03/2022.  Cannot exclude some degree of venous insufficiency as well.  Recommend leg elevation and compression socks.  Update labs today with plans to likely temporarily increase furosemide.  He remains on Toprol-XL 25 mg daily.  Not on ACE inhibitor/ARB/ARNI/MRA/SGLT2 inhibitor in effort to minimize hypotension and fall risk given comorbid conditions and advanced age.  Persistent A-fib/flutter: Asymptomatic and rate controlled with Toprol-XL.  CHA2DS2-VASc at least 6 (CHF, age x 2, CVA x 2, vascular disease).  He remains on renally dosed apixaban (age and weight).  Check renal function, electrolytes, and Hgb.  Mitral regurgitation: Trivial by most recent echo.  Prior echo in 07/2020 was notable for moderate to severe mitral regurgitation.  Previously felt to not be a candidate for TEE or invasive valve repair given advanced age and comorbid  conditions.  Continue medical therapy  Aortic stenosis: Cannot exclude some degree of low-flow low gradient aortic stenosis with underlying cardiomyopathy.  No plans for invasive therapy given advanced age.  Skin tear: No evidence of active bleeding or dehiscence in the office today.  Wound dressed.  Continue with antibiotics and splint as directed by PCP.  Epistaxis: Referred to ENT.  Macrocytic anemia: Check CBC.    Disposition: F/u with Dr. Okey Dupre or an APP in 4 months.   Medication Adjustments/Labs and Tests Ordered: Current medicines are reviewed at length with the patient today.  Concerns regarding medicines are outlined above. Medication changes, Labs and Tests ordered today are summarized above and listed in the Patient Instructions accessible in Encounters.   Signed, Eula Listen, PA-C 09/23/2022 4:06 PM      [facility] [place] [address] [place], [place] [address] [phone]

## 2022-09-23 ENCOUNTER — Other Ambulatory Visit: Payer: Self-pay | Admitting: *Deleted

## 2022-09-23 ENCOUNTER — Other Ambulatory Visit
Admission: RE | Admit: 2022-09-23 | Discharge: 2022-09-23 | Disposition: A | Discharge status: Home / Self Care | Payer: Medicare Other | Source: Ambulatory Visit | Attending: Physician Assistant | Admitting: Physician Assistant

## 2022-09-23 ENCOUNTER — Encounter: Payer: Self-pay | Admitting: Physician Assistant

## 2022-09-23 ENCOUNTER — Ambulatory Visit: Payer: Medicare Other | Attending: Physician Assistant | Admitting: Physician Assistant

## 2022-09-23 VITALS — BP 118/70 | HR 79 | Ht 65.0 in | Wt 121.0 lb

## 2022-09-23 DIAGNOSIS — I5043 Acute on chronic combined systolic (congestive) and diastolic (congestive) heart failure: Secondary | ICD-10-CM

## 2022-09-23 DIAGNOSIS — I340 Nonrheumatic mitral (valve) insufficiency: Secondary | ICD-10-CM

## 2022-09-23 DIAGNOSIS — I2510 Atherosclerotic heart disease of native coronary artery without angina pectoris: Secondary | ICD-10-CM

## 2022-09-23 DIAGNOSIS — N179 Acute kidney failure, unspecified: Secondary | ICD-10-CM

## 2022-09-23 DIAGNOSIS — R040 Epistaxis: Secondary | ICD-10-CM

## 2022-09-23 DIAGNOSIS — E785 Hyperlipidemia, unspecified: Secondary | ICD-10-CM | POA: Diagnosis present

## 2022-09-23 DIAGNOSIS — I5020 Unspecified systolic (congestive) heart failure: Secondary | ICD-10-CM

## 2022-09-23 DIAGNOSIS — S51011A Laceration without foreign body of right elbow, initial encounter: Secondary | ICD-10-CM

## 2022-09-23 DIAGNOSIS — D539 Nutritional anemia, unspecified: Secondary | ICD-10-CM | POA: Diagnosis present

## 2022-09-23 DIAGNOSIS — I350 Nonrheumatic aortic (valve) stenosis: Secondary | ICD-10-CM

## 2022-09-23 DIAGNOSIS — I4819 Other persistent atrial fibrillation: Secondary | ICD-10-CM

## 2022-09-23 LAB — BASIC METABOLIC PANEL
Anion gap: 9 (ref 5–15)
BUN: 38 mg/dL — ABNORMAL HIGH (ref 8–23)
CO2: 29 mmol/L (ref 22–32)
Calcium: 8.8 mg/dL — ABNORMAL LOW (ref 8.9–10.3)
Chloride: 103 mmol/L (ref 98–111)
Creatinine, Ser: 1 mg/dL (ref 0.61–1.24)
GFR, Estimated: >60 mL/min (ref >60)
Glucose, Bld: 96 mg/dL (ref 70–99)
Potassium: 4.6 mmol/L (ref 3.5–5.1)
Sodium: 141 mmol/L (ref 135–145)

## 2022-09-23 LAB — CBC
HCT: 32.7 % — ABNORMAL LOW (ref 39.0–52.0)
Hemoglobin: 10.7 g/dL — ABNORMAL LOW (ref 13.0–17.0)
MCH: 35.4 pg — ABNORMAL HIGH (ref 26.0–34.0)
MCHC: 32.7 g/dL (ref 30.0–36.0)
MCV: 108.3 fL — ABNORMAL HIGH (ref 80.0–100.0)
Platelets: 114 10*3/uL — ABNORMAL LOW (ref 150–400)
RBC: 3.02 MIL/uL — ABNORMAL LOW (ref 4.22–5.81)
RDW: 16.5 % — ABNORMAL HIGH (ref 11.5–15.5)
WBC: 4.2 10*3/uL (ref 4.0–10.5)
nRBC: 0 % (ref 0.0–0.2)

## 2022-09-23 NOTE — Patient Instructions (Signed)
Elevate Legs Wear compression socks/hose  Medication Instructions:  No changes at this time.   *If you need a refill on your cardiac medications before your next appointment, please call your pharmacy*   Lab Work: CBC, BMET to be done today over at the [facility] and check in at registration desk.   If you have labs (blood work) drawn today and your tests are completely normal, you will receive your results only by: MyChart Message (if you have MyChart) OR A paper copy in the mail If you have any lab test that is abnormal or we need to change your treatment, we will call you to review the results.   Testing/Procedures: None   Follow-Up: At [facility], you and your health needs are our priority.  As part of our continuing mission to provide you with exceptional heart care, we have created designated Provider Care Teams.  These Care Teams include your primary Cardiologist (physician) and Advanced Practice Providers (APPs -  Physician Assistants and Nurse Practitioners) who all work together to provide you with the care you need, when you need it.   Your next appointment:   4 month(s)  Provider:   You may see Chilton Si, MD or one of the following Advanced Practice Providers on your designated Care Team:   Nicolasa Ducking, NP Eula Listen, PA-C Cadence Fransico Michael, PA-C Charlsie Quest, NP    Other Instructions  Referral for ENT. Their number is [phone]

## 2022-10-07 ENCOUNTER — Other Ambulatory Visit
Admission: RE | Admit: 2022-10-07 | Discharge: 2022-10-07 | Disposition: A | Discharge status: Home / Self Care | Payer: Medicare Other | Attending: Physician Assistant | Admitting: Physician Assistant

## 2022-10-07 DIAGNOSIS — I5043 Acute on chronic combined systolic (congestive) and diastolic (congestive) heart failure: Secondary | ICD-10-CM | POA: Insufficient documentation

## 2022-10-07 DIAGNOSIS — I2510 Atherosclerotic heart disease of native coronary artery without angina pectoris: Secondary | ICD-10-CM | POA: Insufficient documentation

## 2022-10-07 DIAGNOSIS — I5020 Unspecified systolic (congestive) heart failure: Secondary | ICD-10-CM | POA: Diagnosis present

## 2022-10-07 DIAGNOSIS — I340 Nonrheumatic mitral (valve) insufficiency: Secondary | ICD-10-CM | POA: Diagnosis present

## 2022-10-07 DIAGNOSIS — N179 Acute kidney failure, unspecified: Secondary | ICD-10-CM | POA: Insufficient documentation

## 2022-10-07 DIAGNOSIS — I350 Nonrheumatic aortic (valve) stenosis: Secondary | ICD-10-CM | POA: Insufficient documentation

## 2022-10-07 LAB — BASIC METABOLIC PANEL
Anion gap: 8 (ref 5–15)
BUN: 40 mg/dL — ABNORMAL HIGH (ref 8–23)
CO2: 28 mmol/L (ref 22–32)
Calcium: 8.5 mg/dL — ABNORMAL LOW (ref 8.9–10.3)
Chloride: 100 mmol/L (ref 98–111)
Creatinine, Ser: 1.11 mg/dL (ref 0.61–1.24)
GFR, Estimated: 59 mL/min — ABNORMAL LOW (ref >60)
Glucose, Bld: 104 mg/dL — ABNORMAL HIGH (ref 70–99)
Potassium: 4.2 mmol/L (ref 3.5–5.1)
Sodium: 136 mmol/L (ref 135–145)

## 2022-10-07 LAB — HEMOGLOBIN: Hemoglobin: 10.6 g/dL — ABNORMAL LOW (ref 13.0–17.0)

## 2022-10-07 LAB — HEMATOCRIT: HCT: 31.8 % — ABNORMAL LOW (ref 39.0–52.0)

## 2022-10-13 IMAGING — CT CT HEAD CODE STROKE
3 series · 15 of 47 positions shown, 18 images · non-contrast
Comparison: Head CT 02/23/2020.

CLINICAL DATA: Code stroke. Neuro deficit, acute, stroke suspected.
Additional history provided: Slurred speech.

EXAM:
CT HEAD WITHOUT CONTRAST
TECHNIQUE: Contiguous axial images were obtained from the base of the skull
through the vertex without intravenous contrast.

[Series 3: head 5.0 st · axial · 0.39mm/px · z∈[-97,+48]mm · 9 of 35 slices shown, 12 images]
[im 3/35  brain]
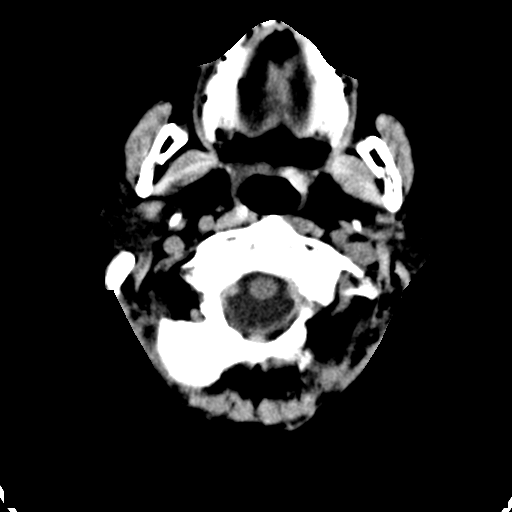
[im 3/35  bone]
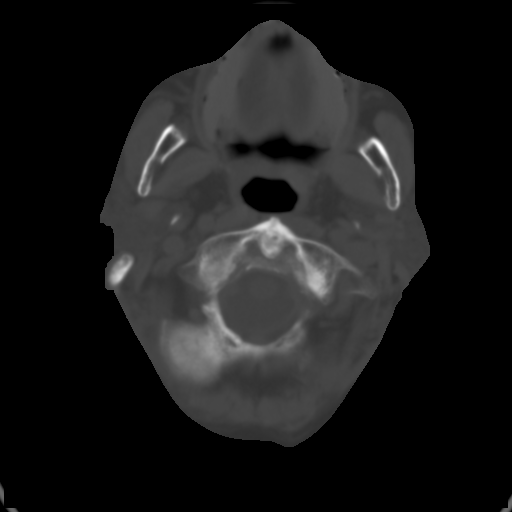
[im 6/35  brain]
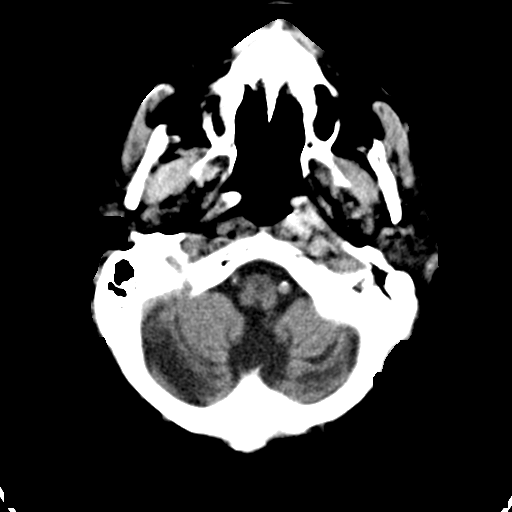
[im 10/35  brain]
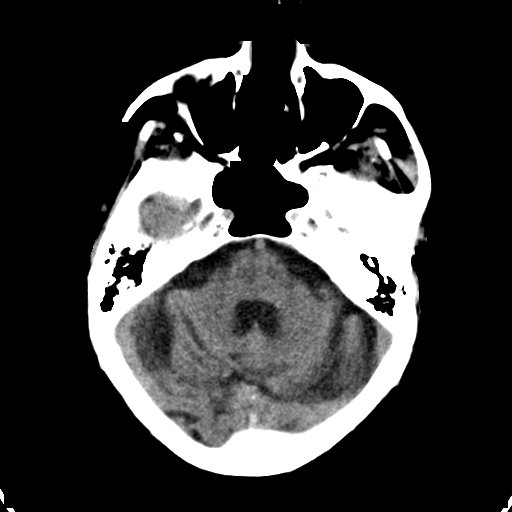
[im 13/35  brain]
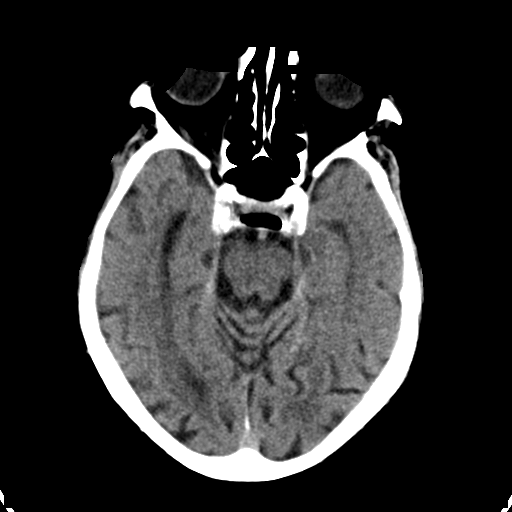
[im 18/35  brain]
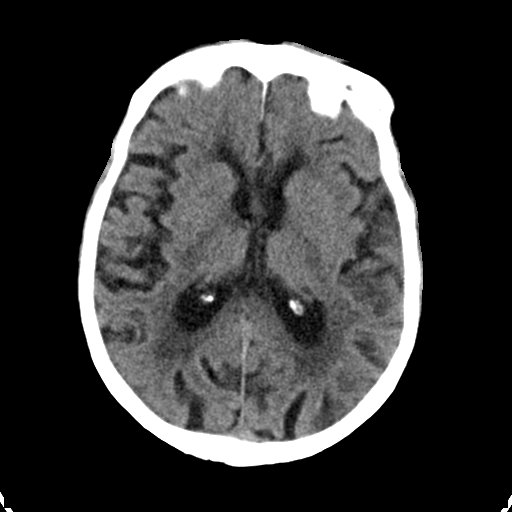
[im 18/35  bone]
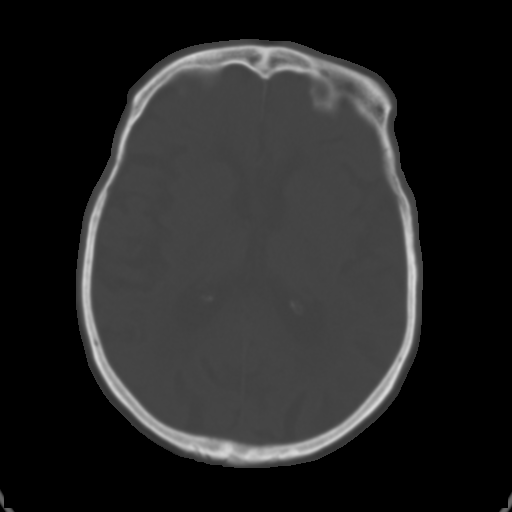
[im 22/35  brain]
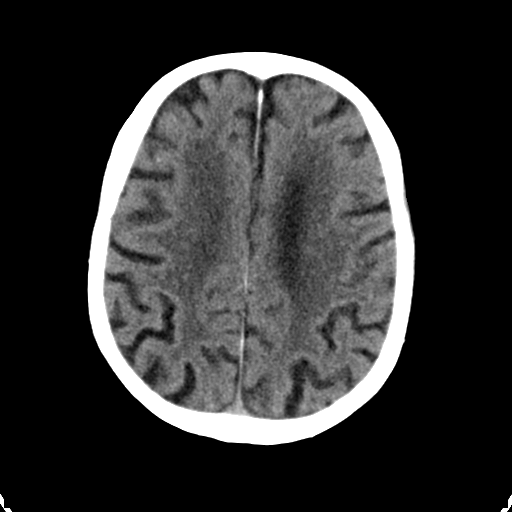
[im 25/35  brain]
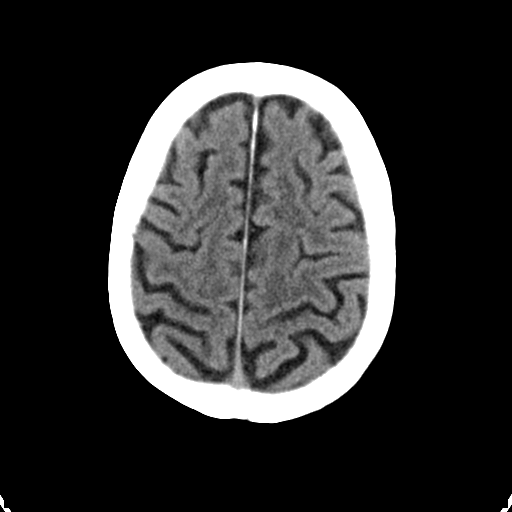
[im 29/35  brain]
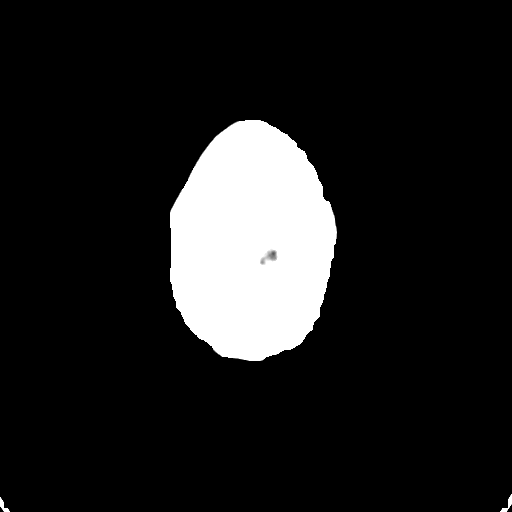
[im 32/35  brain]
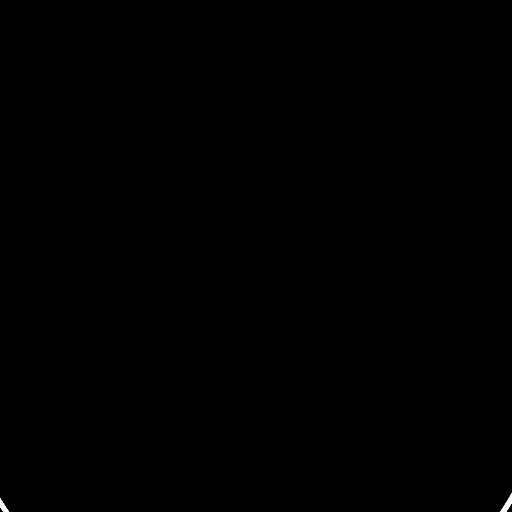
[im 32/35  bone]
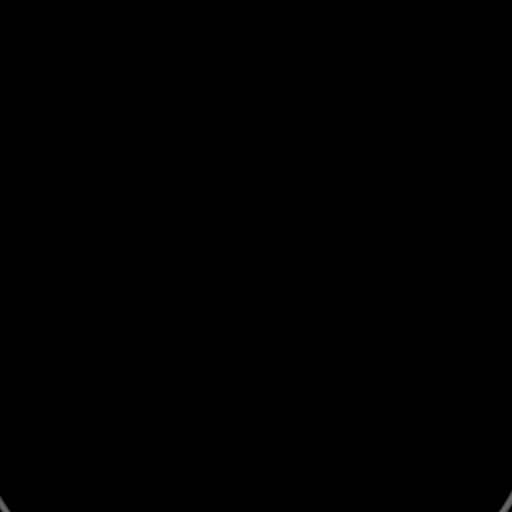

[Series 5: head 3.0 cor st · coronal · 0.33mm/px · 3 of 67 slices shown]
[im 23/67  brain]
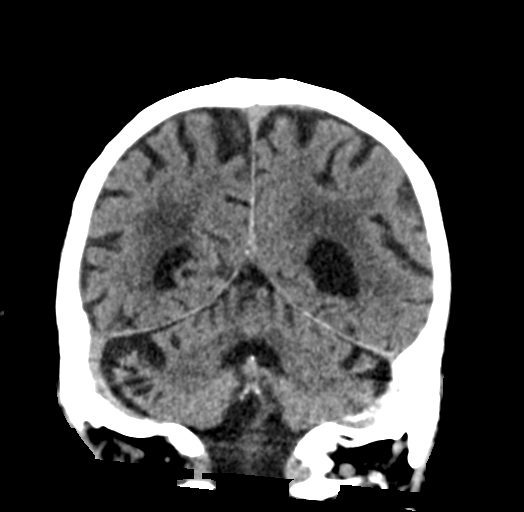
[im 30/67  brain]
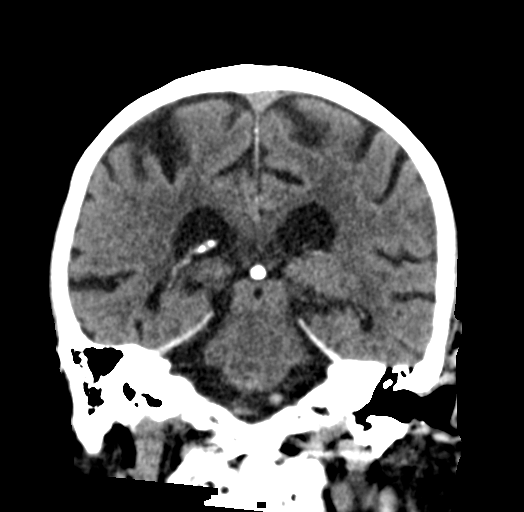
[im 37/67  brain]
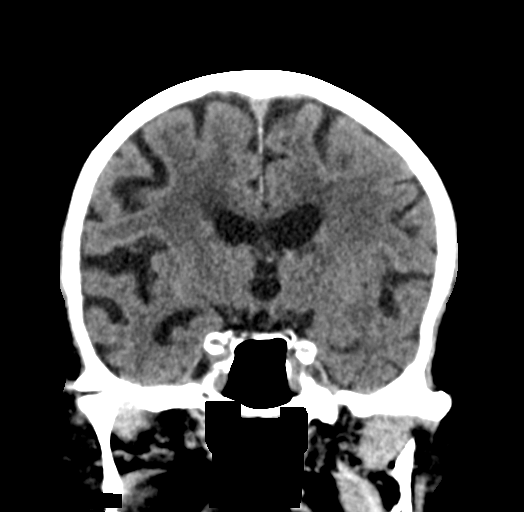

[Series 6: head 3.0 sag st · sagittal · 0.33mm/px · 3 of 67 slices shown]
[im 23/67  brain]
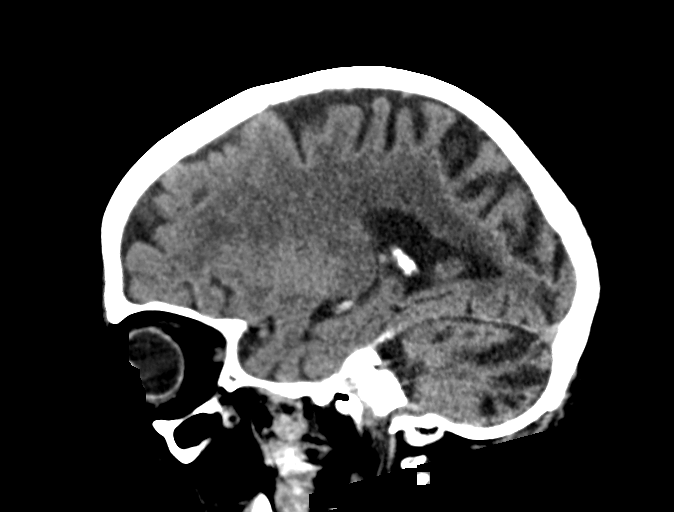
[im 34/67  brain]
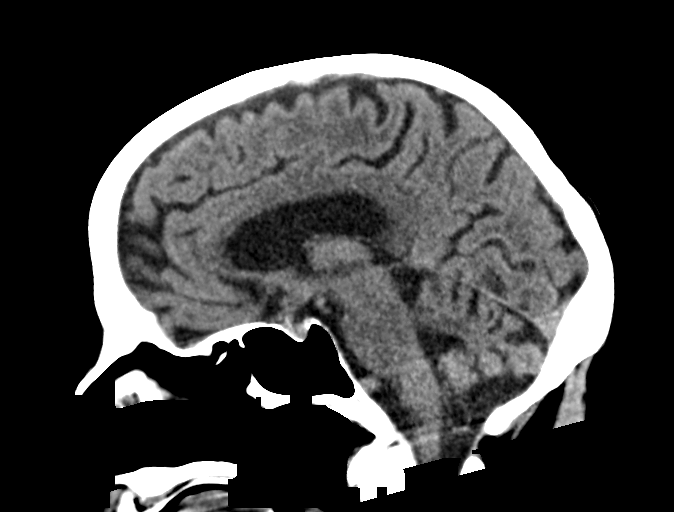
[im 45/67  brain]
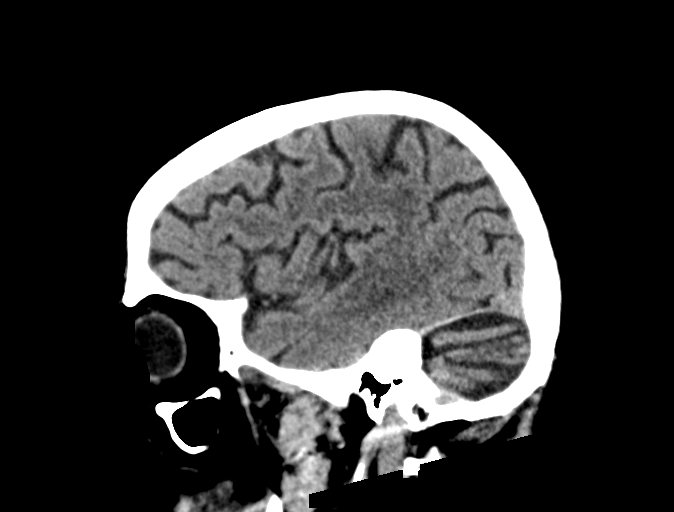

[15 of 47 positions shown; findings below may reference images not displayed]

FINDINGS: Brain:

Mild-to-moderate cerebral atrophy.  Severe cerebellar atrophy.

Redemonstrated chronic lacunar infarcts within bilateral basal
ganglia.

Background moderate ill-defined hypoattenuation within the cerebral
white matter is nonspecific, but compatible with chronic small
vessel ischemic disease.

There is no acute intracranial hemorrhage.

No demarcated cortical infarct.

No extra-axial fluid collection.

No evidence of intracranial mass.

No midline shift.

Vascular: No hyperdense vessel.  Atherosclerotic calcifications.

Skull: Normal. Negative for fracture or focal lesion.

Sinuses/Orbits: Visualized orbits show no acute finding. Mild
bilateral ethmoid and maxillary sinus mucosal thickening. Small
mucous retention cyst within a midline sphenoid sinus.

Other: Trace right mastoid effusion.

ASPECTS (Alberta Stroke Program Early CT Score)

- Ganglionic level infarction (caudate, lentiform nuclei, internal
capsule, insula, M1-M3 cortex): 7

- Supraganglionic infarction (M4-M6 cortex): 3

Total score (0-10 with 10 being normal): 10 (when discounting
chronic infarcts).

These results were communicated to Dr. Databex At [DATE] pmon 07/25/2020by
text page via the [employer] messaging system.
IMPRESSION: No evidence of acute intracranial abnormality.

Redemonstrated chronic lacunar infarcts within the bilateral basal
ganglia.

Background mild-to-moderate cerebral atrophy and moderate cerebral
white matter chronic small vessel ischemic disease.

Redemonstrated severe cerebellar atrophy.

## 2022-12-11 ENCOUNTER — Other Ambulatory Visit (HOSPITAL_BASED_OUTPATIENT_CLINIC_OR_DEPARTMENT_OTHER): Payer: Self-pay | Admitting: Physician Assistant

## 2023-01-26 NOTE — Progress Notes (Signed)
Cardiology Office Note    Date:  01/27/2023   ID:  Alejandro Moore, DOB [birth year], MRN [id number]  PCP:  Marguarite Arbour, MD  Cardiologist:  Chilton Si, MD  Electrophysiologist:  None   Chief Complaint: Follow-up  History of Present Illness:   Alejandro Moore is a 89 y.o. male with history of CAD, HFrEF, PAF/flutter, CVA, mitral regurgitation, aortic stenosis, aortic atherosclerosis, HLD, macrocytic anemia, and macular degeneration who presents for follow-up of CAD, cardiomyopathy, and A-fib/flutter.   He was previously followed by Dr. Gwen Pounds, though has requested to transition his care to [facility] cardiology following his recent admission to [facility].   He was seen in the ED in 2021 for shortness of breath with bedside echo demonstrating an EF of 40%.  He declined admission.  He followed up with the [facility] CHF clinic thereafter and was found to be in A-fib.  In follow-up with Dr. Gwen Pounds, he was back in sinus rhythm.  Echo in 09/2019 showed an EF of 30%, mild LVH, severe mitral regurgitation, moderate tricuspid regurgitation, and mild aortic stenosis.  Prior outside office cardiology notes have stated "The patient has coronary artery disease previously diagnosed by imaging study years ago." CT abdomen/pelvis in 2021 showed coronary calcification of the LCx and RCA.  He has not had a prior stress test or cath.  He has been managed medically.  Echo from 07/2020 demonstrated an EF of 20 to 25%, global hypokinesis, mild concentric LVH, indeterminate LV diastolic function parameters, moderately reduced RV systolic function with normal ventricular cavity size, severely dilated left atrium, moderately dilated right atrium, moderate to severe mitral regurgitation, moderate to severe tricuspid regurgitation, calcified aortic valve with at least mild stenosis (unable to exclude low-flow low gradient component), and an estimated right atrial pressure of 3 mmHg.   He was admitted  to [facility] in 12/2021 after sustaining a fall at home, without LOC and AMS, complicated by acute on chronic HFrEF, AKI, and elevated troponin.  BNP 2065.  High-sensitivity troponin peaked at 208.  Chest x-ray with mild to moderate pulmonary edema with small bilateral pleural effusions and chronic clavicular injury.  Pelvic plain film raised a question of left femoral neck fracture.  CT of the pelvis showed no displaced fracture or dislocation.  CT head/C-spine showed no acute intracranial abnormality with moderate extensive small vessel disease and severe cerebellar atrophy.  Echo during the admission demonstrated an EF of 35 to 40%, global hypokinesis, mild concentric LVH, indeterminate LV diastolic function parameters, moderately reduced RV systolic function with mildly enlarged ventricular cavity size, moderately elevated PASP estimated at 52.1 mmHg, severe biatrial enlargement, trivial mitral regurgitation, mild to moderate tricuspid regurgitation, tricuspid aortic valve with mild to moderate stenosis with a mean gradient of 17.3 mmHg and a valve area of 1.65 cm, and an estimated right atrial pressure of 8 mmHg.  Initially, he was diuresed with noted AKI, however with worsening hypoxic respiratory failure he was rechallenged with IV Lasix with good diuresis and improvement in dyspnea.  He was not felt to be a candidate for invasive work-up with regards to his elevated troponin or valvular heart disease.   He was seen in hospital follow-up on 12/31/2021 and was doing reasonably well, residing in a nursing home.  He was without symptoms of angina or decompensation.  Lower extremity swelling had resolved.  Continued medical therapy was recommended.   He was seen in the office on 02/11/2022 by one of our other providers, and was  off supplemental oxygen.  Patient and family preferred to discontinue atorvastatin.  He was started on Farxiga at that time with continuation of furosemide 20 mg twice daily  and Toprol-XL.   He was seen in the office in 03/2022 and remained without symptoms of angina or cardiac decompensation.  He had not started [place], which we did not pursue further as it was felt the risks outweighed the benefits in this clinical setting.  He was no longer requiring supplemental oxygen.  Functional status was improving.  He was last seen in the office in 09/2022 and was without symptoms of angina or cardiac decompensation.  He was sitting with his feet hanging down for most of the day and in the setting felt like his lower extremities were a little more swollen.  Weight was stable at home.  He also had a skin tear following mechanical trauma after hitting hand/wrist on the corner of a dresser.  They reported a history of epistaxis previously requiring cauterization with referral to ENT placed.  He comes in accompanied by family today and is without symptoms of angina or cardiac decompensation.  Chronic dyspnea is unchanged.  Both patient and family note an increase in bilateral lower extremity swelling with associated weeping, scratches along the medial aspect of the left lower extremity, and mild erythema along these excoriations.  He is without symptoms of early satiety, abdominal distention, PND, or progressive orthopnea.  His weight is down approximately 12 pounds today when compared to his visit in 09/2022.  Family reports the patient gives his food into the dogs at times.  They are supplementing with boost/Ensure.  No palpitations, dizziness, presyncope, or syncope.  No falls, hematochezia, or melena.  He reports chronic diminished hearing involving the right ear.   Labs independently reviewed: 09/2022 -potassium 4.2, BUN 40, serum creatinine 1.11, Hgb 10.6 06/2022 - albumin 3.3, PLT 143, AST/ALT normal 02/2022 - TC 115, TG 61, HDL 41, LDL 62 07/2020 - A1c 6.0, TSH normal  Past Medical History:  Diagnosis Date   Actinic keratosis    Aortic atherosclerosis (HCC)    Aortic  stenosis    BPH (benign prostatic hyperplasia)    Cataract 11/30/2021   Left Eye   Chronic HFrEF (heart failure with reduced ejection fraction) (HCC)    Chronic kidney disease, stage 3a (HCC)    Coronary artery calcification seen on CT scan    CVA (cerebral vascular accident) (HCC)    Macular degeneration    Mitral regurgitation    PAF (paroxysmal atrial fibrillation) (HCC)    Tricuspid regurgitation     Past Surgical History:  Procedure Laterality Date   KNEE SURGERY     SHOULDER SURGERY      Current Medications: Current Meds  Medication Sig   apixaban (ELIQUIS) 2.5 MG TABS tablet Take 1 tablet (2.5 mg total) by mouth 2 (two) times daily.   cephALEXin (KEFLEX) 500 MG capsule Take 1 capsule (500 mg) by mouth twice daily x 7 days   esomeprazole (NEXIUM) 20 MG capsule Take 20 mg by mouth every other day.   furosemide (LASIX) 20 MG tablet TAKE 1 TABLET BY MOUTH TWICE A DAY   metoprolol succinate (TOPROL-XL) 25 MG 24 hr tablet TAKE 1 TABLET (25 MG TOTAL) BY MOUTH DAILY.   Multiple Vitamins-Minerals (PRESERVISION AREDS 2) CAPS Take 1 capsule by mouth 2 (two) times daily.   mupirocin ointment (BACTROBAN) 2 % SMARTSIG:Sparingly Both Nares 3 Times Daily   Omega-3 Fatty Acids (FISH OIL OMEGA-3 PO)  Take by mouth daily.   OVER THE COUNTER MEDICATION Take 1 packet by mouth every morning. Multivitamin pak - [employer] [facility]   tamsulosin (FLOMAX) 0.4 MG CAPS capsule Take 0.4 mg by mouth daily after supper.    Allergies:   Patient has no known allergies.   Social History   Socioeconomic History   Marital status: Widowed    Spouse name: Not on file   Number of children: 2   Years of education: Not on file   Highest education level: Associate degree: occupational, Scientist, product/process development, or vocational program  Occupational History   Not on file  Tobacco Use   Smoking status: Never   Smokeless tobacco: Never  Vaping Use   Vaping status: Never Used  Substance and Sexual Activity    Alcohol use: Never   Drug use: Never   Sexual activity: Not on file  Other Topics Concern   Not on file  Social History Narrative   11/02/20 lives with daughter   Social Determinants of Health   Financial Resource Strain: Not on file  Food Insecurity: Not on file  Transportation Needs: Not on file  Physical Activity: Not on file  Stress: Not on file  Social Connections: Not on file     Family History:  The patient's family history includes Colon cancer in his brother.  ROS:   12-point review of systems is negative unless otherwise noted in the HPI.   EKGs/Labs/Other Studies Reviewed:    Studies reviewed were summarized above. The additional studies were reviewed today:  2D echo 12/21/2021: 1. Left ventricular ejection fraction, by estimation, is 35 to 40%. The  left ventricle has moderately decreased function. The left ventricle  demonstrates global hypokinesis. There is mild concentric left ventricular  hypertrophy. Left ventricular  diastolic function could not be evaluated.   2. Right ventricular systolic function is moderately reduced. The right  ventricular size is mildly enlarged. There is moderately elevated  pulmonary artery systolic pressure. The estimated right ventricular  systolic pressure is 52.1 mmHg.   3. Left atrial size was severely dilated.   4. Right atrial size was severely dilated.   5. The mitral valve is grossly normal. Trivial mitral valve  regurgitation. No evidence of mitral stenosis.   6. The tricuspid valve is abnormal. Tricuspid valve regurgitation is mild  to moderate.   7. The aortic valve is tricuspid. There is moderate calcification of the  aortic valve. There is moderate thickening of the aortic valve. Aortic  valve regurgitation is not visualized. Mild to moderate aortic valve  stenosis. Aortic valve area, by VTI  measures 1.65 cm. Aortic valve mean gradient measures 17.3 mmHg. Aortic  valve Vmax measures 2.96 m/s.   8. The inferior  vena cava is normal in size with <50% respiratory  variability, suggesting right atrial pressure of 8 mmHg. __________   2D echo 07/26/2020: 1. Left ventricular ejection fraction, by estimation, is 20 to 25%. The  left ventricle has severely decreased function. The left ventricle  demonstrates global hypokinesis. There is mild concentric left ventricular  hypertrophy. Left ventricular diastolic   parameters are indeterminate. Elevated left atrial pressure.   2. Right ventricular systolic function is moderately reduced. The right  ventricular size is normal.   3. Left atrial size was severely dilated.   4. Right atrial size was moderately dilated.   5. The mitral valve is grossly normal. Moderate to severe mitral valve  regurgitation. Mechanism is likely atrial functional.   6. Tricuspid  valve regurgitation is moderate to severe.   7. The aortic valve is calcified. There is moderate calcification of the  aortic valve. There is severe thickening of the aortic valve. Aortic valve  regurgitation is not visualized. At least mild aortic valve stenosis, as  we cannot exclude low flow low  gradient component.   8. The inferior vena cava is normal in size with greater than 50%  respiratory variability, suggesting right atrial pressure of 3 mmHg. __________   2D echo 09/24/2019 Gavin Potters): AORTIC ROOT                   Size: Normal             Dissection: INDETERM FOR DISSECTION  AORTIC VALVE               Leaflets: Tricuspid                   Morphology: MILDLY THICKENED               Mobility: PARTIALLY MOBILE  LEFT VENTRICLE                   Size: MODERATELY ENLARGED           Anterior: HYPOCONTRACTILE            Contraction: MOD GLOBAL DECREASE            Lateral: HYPOCONTRACTILE             Closest EF: 30% (Estimated)                 Septal: HYPOCONTRACTILE              LV Masses: No Masses                       Apical: HYPOCONTRACTILE                    LVH: MILD LVH                       Inferior: HYPOCONTRACTILE                                                      Posterior: HYPOCONTRACTILE           Dias.FxClass: N/A  MITRAL VALVE               Leaflets: Normal                        Mobility: Fully mobile             Morphology: Normal  LEFT ATRIUM                   Size: MODERATELY ENLARGED          LA Masses: No masses              IA Septum: Normal IAS  MAIN PA                   Size: Normal  PULMONIC VALVE             Morphology: Normal  Mobility: Fully mobile  RIGHT VENTRICLE              RV Masses: No Masses                         Size: MILDLY ENLARGED              Free Wall: Normal                     Contraction: Normal  TRICUSPID VALVE               Leaflets: Normal                        Mobility: Fully mobile             Morphology: Normal  RIGHT ATRIUM                   Size: MODERATELY ENLARGED           RA Other: None                RA Mass: No masses  PERICARDIUM                  Fluid: No effusion                Pleural eff: SMALL PLEURAL EFFUSION  INFERIOR VENACAVA                   Size: DILATED Normal respiratory collapse  _________________________________________________________________________________________   DOPPLER ECHO and OTHER SPECIAL PROCEDURES                 Aortic: No AR                      MILD AS                         250.2 cm/sec peak vel      25.0 mmHg peak grad                         12.3 mmHg mean grad        1.2 cm^2 by DOPPLER                 Mitral: SEVERE MR                  No MS                         MV Inflow E Vel = 96.0 cm/sec      MV Annulus E'Vel = nm*                         E/E'Ratio = nm*              Tricuspid: MODERATE TR                No TS                         366.5 cm/sec peak TR vel   58.9 mmHg peak RV pressure              Pulmonary: TRIVIAL PR  No PS  _________________________________________________________________________________________   INTERPRETATION  MODERATE LV SYSTOLIC DYSFUNCTION (See above)   WITH MILD LVH  NORMAL RIGHT VENTRICULAR SYSTOLIC FUNCTION  SEVERE VALVULAR REGURGITATION (See above)  MILD VALVULAR STENOSIS (See above)     EKG:  EKG is ordered today.  The EKG ordered today demonstrates atrial flutter with 4:1 AV block, 70 bpm, left axis deviation, poor R wave progression along the precordial leads, LVH, lateral T wave inversion, consistent with prior tracings  Recent Labs: 09/23/2022: Platelets 114 10/07/2022: BUN 40; Creatinine, Ser 1.11; Hemoglobin 10.6; Potassium 4.2; Sodium 136  Recent Lipid Panel    Component Value Date/Time   CHOL 115 02/11/2022 1540   TRIG 61 02/11/2022 1540   HDL 41 02/11/2022 1540   CHOLHDL 2.8 02/11/2022 1540   VLDL 12 02/11/2022 1540   LDLCALC 62 02/11/2022 1540   LDLDIRECT 60 02/11/2022 1540    PHYSICAL EXAM:    VS:  BP 110/70 (BP Location: Left Arm, Patient Position: Sitting, Cuff Size: Normal)   Pulse 70   Ht 5\' 5"  (1.651 m)   Wt 109 lb 12.8 oz (49.8 kg)   SpO2 90%   BMI 18.27 kg/m   BMI: Body mass index is 18.27 kg/m.  Physical Exam Vitals reviewed.  Constitutional:      Appearance: He is well-developed.     Comments: Elderly and frail-appearing.  HENT:     Head: Normocephalic and atraumatic.  Eyes:     General:        Right eye: No discharge.        Left eye: No discharge.  Neck:     Vascular: No JVD.  Cardiovascular:     Rate and Rhythm: Normal rate. Rhythm irregularly irregular.     Heart sounds: S1 normal and S2 normal. Heart sounds not distant. No midsystolic click and no opening snap. Murmur heard.     Systolic murmur is present with a grade of 1/6 at the upper right sternal border.     Systolic murmur of grade 1/6 is also present at the apex.     No friction rub.  Pulmonary:     Effort: Pulmonary effort is normal. No respiratory distress.     Breath sounds: Normal breath sounds. No decreased breath sounds, wheezing, rhonchi or rales.   Chest:     Chest wall: No tenderness.  Abdominal:     General: There is no distension.     Palpations: Abdomen is soft.     Tenderness: There is no abdominal tenderness.  Musculoskeletal:     Cervical back: Normal range of motion.     Right lower leg: Edema present.     Left lower leg: Edema present.     Comments: 2+ lower extremity doughy pitting edema to the knee bilaterally with excoriations and mild erythema noted on the medial aspect of the left lower extremity.  Skin:    General: Skin is warm and dry.     Nails: There is no clubbing.  Neurological:     Mental Status: He is alert and oriented to person, place, and time.  Psychiatric:        Speech: Speech normal.        Behavior: Behavior normal.        Thought Content: Thought content normal.        Judgment: Judgment normal.     Wt Readings from Last 3 Encounters:  01/27/23 109 lb 12.8 oz (49.8 kg)  09/23/22 121 lb (54.9 kg)  03/14/22 116 lb 12.8 oz (53 kg)     ASSESSMENT & PLAN:   CAD involving the native coronary arteries without angina: He is without symptoms of angina.  Reports chronic longstanding dyspnea that is stable.  Previously not felt to be a candidate for invasive workup given advanced age and comorbid conditions.  In this setting, no indication for further cardiac testing.  Continue aggressive risk factor modification and current medical therapy including apixaban in place of aspirin, given underlying A-fib/flutter, along with Toprol-XL.  No longer on statin therapy at family request which is reasonable given his advanced age.  HFrEF with lower extremity swelling and left lower extremity cellulitis: He presents with increased lower extremity swelling today that is likely multifactorial including dependent edema from a sitting with his feet hanging down the majority of the day, possible third spacing from a possible hypoalbuminemia and underlying anemia, and a degree of cardiomyopathy.  Recommend leg elevation  and loose wraps.  For mild cellulitis we will send in Keflex 500 mg twice daily for 7 days.  Obtain a CBC and CMP.  For now, remains on furosemide 20 mg once daily with further recommendations pending updated labs.  Not on ACE inhibitor, ARB, or ARNI/MRA/SGLT2 inhibitor in an effort to minimize hypotension and fall risk given comorbid conditions and advanced age.  Not a candidate for invasive heart failure modalities.  Persistent A-fib/flutter: Asymptomatic and rate controlled with Toprol-XL.  CHA2DS2-VASc at least 6 (CHF, age x 2, CVA x 2, vascular disease).  Remains on apixaban 2.5 mg twice daily (age and weight).  Update labs as above.  Mitral regurgitation: Trivial by most recent echo.  Prior echo in 07/2020 was notable for moderate to severe mitral regurgitation.  Previously felt to not be a candidate for TEE or invasive valve repair given advanced age and comorbid conditions.  Continue medical therapy.  Aortic stenosis: Cannot exclude some degree of low-flow low gradient aortic stenosis with underlying cardiomyopathy.  No plans for invasive therapy given advanced age.   Aortic atherosclerosis/HLD: LDL 62 in 02/2022.  No longer on statin therapy given advanced age.  Macrocytic anemia: Denies symptoms for bleeding.  Check CBC.   Disposition: F/u with Dr. Okey Dupre or an APP in 2 weeks.   Medication Adjustments/Labs and Tests Ordered: Current medicines are reviewed at length with the patient today.  Concerns regarding medicines are outlined above. Medication changes, Labs and Tests ordered today are summarized above and listed in the Patient Instructions accessible in Encounters.   Signed, Eula Listen, PA-C 01/27/2023 4:39 PM     [facility] [place] [address] [place], [place] [address] [phone]

## 2023-01-27 ENCOUNTER — Ambulatory Visit: Payer: Medicare Other | Attending: Physician Assistant | Admitting: Physician Assistant

## 2023-01-27 ENCOUNTER — Encounter: Payer: Self-pay | Admitting: Physician Assistant

## 2023-01-27 VITALS — BP 110/70 | HR 70 | Ht 65.0 in | Wt 109.8 lb

## 2023-01-27 DIAGNOSIS — I340 Nonrheumatic mitral (valve) insufficiency: Secondary | ICD-10-CM | POA: Insufficient documentation

## 2023-01-27 DIAGNOSIS — I2510 Atherosclerotic heart disease of native coronary artery without angina pectoris: Secondary | ICD-10-CM | POA: Insufficient documentation

## 2023-01-27 DIAGNOSIS — L03116 Cellulitis of left lower limb: Secondary | ICD-10-CM | POA: Diagnosis not present

## 2023-01-27 DIAGNOSIS — I700 Atherosclerosis of aorta: Secondary | ICD-10-CM | POA: Insufficient documentation

## 2023-01-27 DIAGNOSIS — I4819 Other persistent atrial fibrillation: Secondary | ICD-10-CM | POA: Diagnosis present

## 2023-01-27 DIAGNOSIS — D539 Nutritional anemia, unspecified: Secondary | ICD-10-CM | POA: Diagnosis present

## 2023-01-27 DIAGNOSIS — M7989 Other specified soft tissue disorders: Secondary | ICD-10-CM | POA: Diagnosis not present

## 2023-01-27 DIAGNOSIS — E785 Hyperlipidemia, unspecified: Secondary | ICD-10-CM | POA: Insufficient documentation

## 2023-01-27 DIAGNOSIS — I350 Nonrheumatic aortic (valve) stenosis: Secondary | ICD-10-CM | POA: Insufficient documentation

## 2023-01-27 DIAGNOSIS — I5020 Unspecified systolic (congestive) heart failure: Secondary | ICD-10-CM | POA: Insufficient documentation

## 2023-01-27 MED ORDER — CEPHALEXIN 500 MG PO CAPS: ORAL_CAPSULE | ORAL | 0 refills | Status: DC

## 2023-01-27 NOTE — Patient Instructions (Signed)
Medication Instructions:  - Your physician has recommended you make the following change in your medication:   1) START Keflex (Cephalexin) 500 mg: - take 1 capsule by mouth TWICE daily (or about every 12 hours) x 7 days  *If you need a refill on your cardiac medications before your next appointment, please call your pharmacy*   Lab Work: - Your physician recommends that you have lab work today:  CBC/ CMET  If you have labs (blood work) drawn today and your tests are completely normal, you will receive your results only by: [employer] (if you have MyChart) OR A paper copy in the mail If you have any lab test that is abnormal or we need to change your treatment, we will call you to review the results.   Testing/Procedures: - none ordered   Follow-Up: At [facility], you and your health needs are our priority.  As part of our continuing mission to provide you with exceptional heart care, we have created designated Provider Care Teams.  These Care Teams include your primary Cardiologist (physician) and Advanced Practice Providers (APPs -  Physician Assistants and Nurse Practitioners) who all work together to provide you with the care you need, when you need it.  We recommend signing up for the patient portal called "MyChart".  Sign up information is provided on this After Visit Summary.  MyChart is used to connect with patients for Virtual Visits (Telemedicine).  Patients are able to view lab/test results, encounter notes, upcoming appointments, etc.  Non-urgent messages can be sent to your provider as well.   To learn more about what you can do with MyChart, go to [email or web address].    Your next appointment:   2-4 week(s)  Provider:   Eula Listen, PA-C    Other Instructions  Cephalexin Capsules or Tablets What is this medication? CEPHALEXIN (sef a LEX in) treats infections caused by bacteria. It belongs to a group of medications called cephalosporin  antibiotics. It will not treat colds, the flu, or infections caused by viruses. This medicine may be used for other purposes; ask your health care provider or pharmacist if you have questions. COMMON BRAND NAME(S): Biocef, Daxbia, Keflex, Keftab What should I tell my care team before I take this medication? They need to know if you have any of these conditions: Bleeding disorder Kidney disease Liver disease Seizures Stomach or intestine problems, such as colitis An unusual or allergic reaction to cephalexin, other penicillin or cephalosporin antibiotics, other medications, foods, dyes, or preservatives Pregnant or trying to get pregnant Breastfeeding How should I use this medication? Take this medication by mouth. Take it as directed on the prescription label at the same time every day. You can take it with or without food. If it upsets your stomach, take it with food. Take all of this medication unless your care team tells you to stop it early. Keep taking it even if you think you are better. Talk to your care team about the use of this medication in children. While it may be prescribed for selected conditions, precautions do apply. Overdosage: If you think you have taken too much of this medicine contact a poison control center or emergency room at once. NOTE: This medicine is only for you. Do not share this medicine with others. What if I miss a dose? If you miss a dose, take it as soon as you can. If it is almost time for your next dose, take only that dose. Do not take double  or extra doses. What may interact with this medication? Probenecid Some other antibiotics This list may not describe all possible interactions. Give your health care provider a list of all the medicines, herbs, non-prescription drugs, or dietary supplements you use. Also tell them if you smoke, drink alcohol, or use illegal drugs. Some items may interact with your medicine. What should I watch for while using this  medication? Tell your care team if your symptoms do not start to get better or if they get worse. Do not treat diarrhea with over the counter products. Contact your care team if you have diarrhea that lasts more than 2 days or if it is severe and watery. This medication may cause serious skin reactions. They can happen weeks to months after starting the medication. Contact your care team right away if you notice fevers or flu-like symptoms with a rash. The rash may be red or purple and then turn into blisters or peeling of the skin. Or, you might notice a red rash with swelling of the face, lips or lymph nodes in your neck or under your arms. If you have diabetes, you may get a false-positive result for sugar in your urine. Check with your care team. What side effects may I notice from receiving this medication? Side effects that you should report to your care team as soon as possible: Allergic reactions--skin rash, itching, hives, swelling of the face, lips, tongue, or throat Redness, blistering, peeling, or loosening of the skin, including inside the mouth Severe diarrhea, fever Unusual vaginal discharge, itching, or odor Side effects that usually do not require medical attention (report to your care team if they continue or are bothersome): Diarrhea Headache Nausea This list may not describe all possible side effects. Call your doctor for medical advice about side effects. You may report side effects to FDA at 1-800-FDA-1088. Where should I keep my medication? Keep out of the reach of children and pets. Store at room temperature between 20 and 25 degrees C (68 and 77 degrees F). Throw away any unused medication after the expiration date. NOTE: This sheet is a summary. It may not cover all possible information. If you have questions about this medicine, talk to your doctor, pharmacist, or health care provider.  2024 [employer] (2021-12-20 00:00:00)

## 2023-01-28 LAB — COMPREHENSIVE METABOLIC PANEL
ALT: 18 [IU]/L (ref 0–44)
AST: 24 [IU]/L (ref 0–40)
Albumin: 3.5 g/dL — ABNORMAL LOW (ref 3.6–4.6)
Alkaline Phosphatase: 104 [IU]/L (ref 44–121)
BUN/Creatinine Ratio: 31 — ABNORMAL HIGH (ref 10–24)
BUN: 36 mg/dL (ref 10–36)
Bilirubin Total: 0.6 mg/dL (ref 0.0–1.2)
CO2: 30 mmol/L — ABNORMAL HIGH (ref 20–29)
Calcium: 8.9 mg/dL (ref 8.6–10.2)
Chloride: 101 mmol/L (ref 96–106)
Creatinine, Ser: 1.16 mg/dL (ref 0.76–1.27)
Globulin, Total: 2.7 g/dL (ref 1.5–4.5)
Glucose: 82 mg/dL (ref 70–99)
Potassium: 4.4 mmol/L (ref 3.5–5.2)
Sodium: 146 mmol/L — ABNORMAL HIGH (ref 134–144)
Total Protein: 6.2 g/dL (ref 6.0–8.5)
eGFR: 56 mL/min/{1.73_m2} — ABNORMAL LOW (ref >59)

## 2023-01-28 LAB — CBC
Hematocrit: 35.6 % — ABNORMAL LOW (ref 37.5–51.0)
Hemoglobin: 11.6 g/dL — ABNORMAL LOW (ref 13.0–17.7)
MCH: 35.9 pg — ABNORMAL HIGH (ref 26.6–33.0)
MCHC: 32.6 g/dL (ref 31.5–35.7)
MCV: 110 fL — ABNORMAL HIGH (ref 79–97)
Platelets: 93 10*3/uL — CL (ref 150–450)
RBC: 3.23 x10E6/uL — ABNORMAL LOW (ref 4.14–5.80)
RDW: 13.2 % (ref 11.6–15.4)
WBC: 4.1 10*3/uL (ref 3.4–10.8)

## 2023-01-31 ENCOUNTER — Telehealth: Payer: Self-pay | Admitting: Physician Assistant

## 2023-01-31 NOTE — Telephone Encounter (Signed)
  Pt's daughter is requesting if there's a possibility if Dr. Okey Dupre or Eula Listen can order a home health nurse for the pt that can visit him at least 3x a week.

## 2023-01-31 NOTE — Telephone Encounter (Signed)
That order would best be placed by the patient's PCP.  Yvonne Kendall, MD [facility]

## 2023-02-01 NOTE — Telephone Encounter (Signed)
Called patient daughter, advised to seek home health care with patients PCP.  Daughter verbalized understanding, thankful for call back

## 2023-02-01 NOTE — Telephone Encounter (Signed)
Agree with Dr. Okey Dupre. This is something that needs to be handled by his PCP.

## 2023-02-10 NOTE — Progress Notes (Signed)
Cardiology Office Note    Date:  02/17/2023   ID:  Alejandro Moore, DOB [birth year], MRN [id number]  PCP:  Marguarite Arbour, MD  Cardiologist:  Chilton Si, MD  Electrophysiologist:  None   Chief Complaint: Follow up  History of Present Illness:   Alejandro Moore is a 89 y.o. male with history of CAD, HFrEF, PAF/flutter, CVA, mitral regurgitation, aortic stenosis, aortic atherosclerosis, HLD, macrocytic anemia, and macular degeneration who presents for follow-up of lower extremity swelling/cellulitis.    He was previously followed by Dr. Gwen Pounds, though has requested to transition his care to [facility] cardiology following his recent admission to [facility].   He was seen in the ED in 2021 for shortness of breath with bedside echo demonstrating an EF of 40%.  He declined admission.  He followed up with the [facility] CHF clinic thereafter and was found to be in A-fib.  In follow-up with Dr. Gwen Pounds, he was back in sinus rhythm.  Echo in 09/2019 showed an EF of 30%, mild LVH, severe mitral regurgitation, moderate tricuspid regurgitation, and mild aortic stenosis.  Prior outside office cardiology notes have stated "The patient has coronary artery disease previously diagnosed by imaging study years ago." CT abdomen/pelvis in 2021 showed coronary calcification of the LCx and RCA.  He has not had a prior stress test or cath.  He has been managed medically.  Echo from 07/2020 demonstrated an EF of 20 to 25%, global hypokinesis, mild concentric LVH, indeterminate LV diastolic function parameters, moderately reduced RV systolic function with normal ventricular cavity size, severely dilated left atrium, moderately dilated right atrium, moderate to severe mitral regurgitation, moderate to severe tricuspid regurgitation, calcified aortic valve with at least mild stenosis (unable to exclude low-flow low gradient component), and an estimated right atrial pressure of 3 mmHg.   He was admitted to  [facility] in 12/2021 after sustaining a fall at home, without LOC and AMS, complicated by acute on chronic HFrEF, AKI, and elevated troponin.  BNP 2065.  High-sensitivity troponin peaked at 208.  Chest x-ray with mild to moderate pulmonary edema with small bilateral pleural effusions and chronic clavicular injury.  Pelvic plain film raised a question of left femoral neck fracture.  CT of the pelvis showed no displaced fracture or dislocation.  CT head/C-spine showed no acute intracranial abnormality with moderate extensive small vessel disease and severe cerebellar atrophy.  Echo during the admission demonstrated an EF of 35 to 40%, global hypokinesis, mild concentric LVH, indeterminate LV diastolic function parameters, moderately reduced RV systolic function with mildly enlarged ventricular cavity size, moderately elevated PASP estimated at 52.1 mmHg, severe biatrial enlargement, trivial mitral regurgitation, mild to moderate tricuspid regurgitation, tricuspid aortic valve with mild to moderate stenosis with a mean gradient of 17.3 mmHg and a valve area of 1.65 cm, and an estimated right atrial pressure of 8 mmHg.  Initially, he was diuresed with noted AKI, however with worsening hypoxic respiratory failure he was rechallenged with IV Lasix with good diuresis and improvement in dyspnea.  He was not felt to be a candidate for invasive work-up with regards to his elevated troponin or valvular heart disease.   He was seen in hospital follow-up on 12/31/2021 and was doing reasonably well, residing in a nursing home.  He was without symptoms of angina or decompensation.  Lower extremity swelling had resolved.  Continued medical therapy was recommended.   He was seen in the office on 02/11/2022 by one of our other providers, and  was off supplemental oxygen.  Patient and family preferred to discontinue atorvastatin.  He was started on Farxiga at that time with continuation of furosemide 20 mg twice daily and  Toprol-XL.   He was seen in the office in 03/2022 and remained without symptoms of angina or cardiac decompensation.  He had not started [place], which we did not pursue further as it was felt the risks outweighed the benefits in this clinical setting.  He was no longer requiring supplemental oxygen.  Functional status was improving.   He was seen in the office in 09/2022 and was without symptoms of angina or cardiac decompensation.  He was sitting with his feet hanging down for most of the day and in the setting felt like his lower extremities were a little more swollen.  Weight was stable at home.  He also had a skin tear following mechanical trauma after hitting hand/wrist on the corner of a dresser.  They reported a history of epistaxis previously requiring cauterization with referral to ENT made.  He was last seen in the office on 01/27/2023 and remained without symptoms of angina or cardiac decompensation.  Chronic dyspnea was unchanged.  Patient and family continue to note an increase in bilateral lower extremity swelling with associated weeping and excoriation.  He continued to sit with his feet hanging down for extended portions of the day.  His weight was down approximately 12 pounds by our scale when compared to his visit in 09/2022.  The family reported the patient gave his food into the dogs at times.  He was started on Keflex 500 mg twice daily for 7 days for management of mild cellulitis with recommendation to continue furosemide 20 mg daily.  Leg elevation was recommended.  He followed up with his PCP on 02/07/2023 with noted improvement in mild lower extremity cellulitis.  He comes in doing well from a cardiac perspective and is without symptoms of angina or cardiac decompensation.  Family present indicated the lower extremity swelling and cellulitis is improved.  Patient is without symptoms of angina or cardiac decompensation.  No significant dyspnea, palpitations, dizziness, presyncope, or  syncope.  He does continue to have intermittent episodes of epistaxis that have previously been evaluated by ENT without cauterization at that time.  Continues to sit with legs hanging down for extended time frames.  Weight is down 4 pounds today when compared to last clinic visit.  No falls, hematochezia, or melena.   Labs independently reviewed: 01/2023 - BUN 36, serum creatinine 1.16, potassium 4.4, albumin 3.5, AST/ALT normal, Hgb 11.6, PLT 93 02/2022 - TC 115, TG 61, HDL 41, LDL 62 07/2020 - A1c 6.0, TSH normal  Past Medical History:  Diagnosis Date   Actinic keratosis    Aortic atherosclerosis (HCC)    Aortic stenosis    BPH (benign prostatic hyperplasia)    Cataract 11/30/2021   Left Eye   Chronic HFrEF (heart failure with reduced ejection fraction) (HCC)    Chronic kidney disease, stage 3a (HCC)    Coronary artery calcification seen on CT scan    CVA (cerebral vascular accident) (HCC)    Macular degeneration    Mitral regurgitation    PAF (paroxysmal atrial fibrillation) (HCC)    Tricuspid regurgitation     Past Surgical History:  Procedure Laterality Date   KNEE SURGERY     SHOULDER SURGERY      Current Medications: Current Meds  Medication Sig   apixaban (ELIQUIS) 2.5 MG TABS tablet Take 1 tablet (  2.5 mg total) by mouth 2 (two) times daily.   cephALEXin (KEFLEX) 500 MG capsule Take 1 capsule (500 mg) by mouth twice daily x 7 days   esomeprazole (NEXIUM) 20 MG capsule Take 20 mg by mouth every other day.   furosemide (LASIX) 20 MG tablet TAKE 1 TABLET BY MOUTH TWICE A DAY   metoprolol succinate (TOPROL-XL) 25 MG 24 hr tablet TAKE 1 TABLET (25 MG TOTAL) BY MOUTH DAILY.   Multiple Vitamins-Minerals (PRESERVISION AREDS 2) CAPS Take 1 capsule by mouth 2 (two) times daily.   mupirocin ointment (BACTROBAN) 2 % SMARTSIG:Sparingly Both Nares 3 Times Daily   Omega-3 Fatty Acids (FISH OIL OMEGA-3 PO) Take by mouth daily.   OVER THE COUNTER MEDICATION Take 1 packet by mouth  every morning. Multivitamin pak - [facility]   tamsulosin (FLOMAX) 0.4 MG CAPS capsule Take 0.4 mg by mouth daily after supper.    Allergies:   Patient has no known allergies.   Social History   Socioeconomic History   Marital status: Widowed    Spouse name: Not on file   Number of children: 2   Years of education: Not on file   Highest education level: Associate degree: occupational, Scientist, product/process development, or vocational program  Occupational History   Not on file  Tobacco Use   Smoking status: Never   Smokeless tobacco: Never  Vaping Use   Vaping status: Never Used  Substance and Sexual Activity   Alcohol use: Never   Drug use: Never   Sexual activity: Not on file  Other Topics Concern   Not on file  Social History Narrative   11/02/20 lives with daughter   Social Determinants of Health   Financial Resource Strain: Not on file  Food Insecurity: Not on file  Transportation Needs: Not on file  Physical Activity: Not on file  Stress: Not on file  Social Connections: Not on file     Family History:  The patient's family history includes Colon cancer in his brother.  ROS:   12-point review of systems is negative unless otherwise noted in the HPI.   EKGs/Labs/Other Studies Reviewed:    Studies reviewed were summarized above. The additional studies were reviewed today:  2D echo 12/21/2021: 1. Left ventricular ejection fraction, by estimation, is 35 to 40%. The  left ventricle has moderately decreased function. The left ventricle  demonstrates global hypokinesis. There is mild concentric left ventricular  hypertrophy. Left ventricular  diastolic function could not be evaluated.   2. Right ventricular systolic function is moderately reduced. The right  ventricular size is mildly enlarged. There is moderately elevated  pulmonary artery systolic pressure. The estimated right ventricular  systolic pressure is 52.1 mmHg.   3. Left atrial size was severely  dilated.   4. Right atrial size was severely dilated.   5. The mitral valve is grossly normal. Trivial mitral valve  regurgitation. No evidence of mitral stenosis.   6. The tricuspid valve is abnormal. Tricuspid valve regurgitation is mild  to moderate.   7. The aortic valve is tricuspid. There is moderate calcification of the  aortic valve. There is moderate thickening of the aortic valve. Aortic  valve regurgitation is not visualized. Mild to moderate aortic valve  stenosis. Aortic valve area, by VTI  measures 1.65 cm. Aortic valve mean gradient measures 17.3 mmHg. Aortic  valve Vmax measures 2.96 m/s.   8. The inferior vena cava is normal in size with <50% respiratory  variability, suggesting right atrial  pressure of 8 mmHg. __________   2D echo 07/26/2020: 1. Left ventricular ejection fraction, by estimation, is 20 to 25%. The  left ventricle has severely decreased function. The left ventricle  demonstrates global hypokinesis. There is mild concentric left ventricular  hypertrophy. Left ventricular diastolic   parameters are indeterminate. Elevated left atrial pressure.   2. Right ventricular systolic function is moderately reduced. The right  ventricular size is normal.   3. Left atrial size was severely dilated.   4. Right atrial size was moderately dilated.   5. The mitral valve is grossly normal. Moderate to severe mitral valve  regurgitation. Mechanism is likely atrial functional.   6. Tricuspid valve regurgitation is moderate to severe.   7. The aortic valve is calcified. There is moderate calcification of the  aortic valve. There is severe thickening of the aortic valve. Aortic valve  regurgitation is not visualized. At least mild aortic valve stenosis, as  we cannot exclude low flow low  gradient component.   8. The inferior vena cava is normal in size with greater than 50%  respiratory variability, suggesting right atrial pressure of 3 mmHg. __________   2D echo  09/24/2019 Gavin Potters): AORTIC ROOT                   Size: Normal             Dissection: INDETERM FOR DISSECTION  AORTIC VALVE               Leaflets: Tricuspid                   Morphology: MILDLY THICKENED               Mobility: PARTIALLY MOBILE  LEFT VENTRICLE                   Size: MODERATELY ENLARGED           Anterior: HYPOCONTRACTILE            Contraction: MOD GLOBAL DECREASE            Lateral: HYPOCONTRACTILE             Closest EF: 30% (Estimated)                 Septal: HYPOCONTRACTILE              LV Masses: No Masses                       Apical: HYPOCONTRACTILE                    LVH: MILD LVH                      Inferior: HYPOCONTRACTILE                                                      Posterior: HYPOCONTRACTILE           Dias.FxClass: N/A  MITRAL VALVE               Leaflets: Normal                        Mobility: Fully mobile  Morphology: Normal  LEFT ATRIUM                   Size: MODERATELY ENLARGED          LA Masses: No masses              IA Septum: Normal IAS  MAIN PA                   Size: Normal  PULMONIC VALVE             Morphology: Normal                        Mobility: Fully mobile  RIGHT VENTRICLE              RV Masses: No Masses                         Size: MILDLY ENLARGED              Free Wall: Normal                     Contraction: Normal  TRICUSPID VALVE               Leaflets: Normal                        Mobility: Fully mobile             Morphology: Normal  RIGHT ATRIUM                   Size: MODERATELY ENLARGED           RA Other: None                RA Mass: No masses  PERICARDIUM                  Fluid: No effusion                Pleural eff: SMALL PLEURAL EFFUSION  INFERIOR VENACAVA                   Size: DILATED Normal respiratory collapse  _________________________________________________________________________________________   DOPPLER ECHO and OTHER SPECIAL PROCEDURES                 Aortic: No AR                       MILD AS                         250.2 cm/sec peak vel      25.0 mmHg peak grad                         12.3 mmHg mean grad        1.2 cm^2 by DOPPLER                 Mitral: SEVERE MR                  No MS                         MV Inflow E Vel = 96.0 cm/sec      MV Annulus E'Vel = nm*  E/E'Ratio = nm*              Tricuspid: MODERATE TR                No TS                         366.5 cm/sec peak TR vel   58.9 mmHg peak RV pressure              Pulmonary: TRIVIAL PR                 No PS  _________________________________________________________________________________________  INTERPRETATION  MODERATE LV SYSTOLIC DYSFUNCTION (See above)   WITH MILD LVH  NORMAL RIGHT VENTRICULAR SYSTOLIC FUNCTION  SEVERE VALVULAR REGURGITATION (See above)  MILD VALVULAR STENOSIS (See above)   EKG:  EKG is not ordered today.    Recent Labs: 01/27/2023: ALT 18; BUN 36; Creatinine, Ser 1.16; Hemoglobin 11.6; Platelets 93; Potassium 4.4; Sodium 146  Recent Lipid Panel    Component Value Date/Time   CHOL 115 02/11/2022 1540   TRIG 61 02/11/2022 1540   HDL 41 02/11/2022 1540   CHOLHDL 2.8 02/11/2022 1540   VLDL 12 02/11/2022 1540   LDLCALC 62 02/11/2022 1540   LDLDIRECT 60 02/11/2022 1540    PHYSICAL EXAM:    VS:  BP 116/73 (BP Location: Left Arm, Patient Position: Sitting, Cuff Size: Normal)   Pulse 72   Wt 105 lb 12.8 oz (48 kg)   BMI 17.61 kg/m   BMI: Body mass index is 17.61 kg/m.  Physical Exam Vitals reviewed.  Constitutional:      Appearance: Normal appearance. He is underweight.  HENT:     Head: Normocephalic and atraumatic.  Eyes:     General:        Right eye: No discharge.        Left eye: No discharge.  Neck:     Vascular: No JVD.  Cardiovascular:     Rate and Rhythm: Normal rate. Rhythm irregularly irregular.     Heart sounds: S1 normal and S2 normal. Heart sounds not distant. No midsystolic click and no opening snap. Murmur  heard.     Systolic murmur is present with a grade of 1/6 at the upper right sternal border.     Systolic murmur of grade 1/6 is also present at the apex.     No friction rub.  Pulmonary:     Effort: Pulmonary effort is normal. No respiratory distress.     Breath sounds: Normal breath sounds. No decreased breath sounds, wheezing, rhonchi or rales.  Chest:     Chest wall: No tenderness.  Abdominal:     General: There is no distension.  Musculoskeletal:     Cervical back: Normal range of motion.     Right lower leg: Edema present.     Left lower leg: Edema present.     Comments: 1+ bilateral doughy pitting edema of the lower extremities extending to below the knee.  No evidence of weeping, or erythema/cellulitis.  Skin:    General: Skin is warm and dry.     Nails: There is no clubbing.  Neurological:     Mental Status: He is alert and oriented to person, place, and time.  Psychiatric:        Speech: Speech normal.        Behavior: Behavior normal.        Thought Content: Thought content normal.  Judgment: Judgment normal.     Wt Readings from Last 3 Encounters:  02/17/23 105 lb 12.8 oz (48 kg)  01/27/23 109 lb 12.8 oz (49.8 kg)  09/23/22 121 lb (54.9 kg)     ASSESSMENT & PLAN:   HFrEF with lower extremity swelling lower extremity cellulitis: Resolved lower extremity cellulitis.  Lower extremity swelling is improved, though not resolved and likely a component of dependent edema from sitting with his feet hanging down the majority of the day as well as some degree of third spacing from underlying anemia and with cardiomyopathy.  Recommend leg elevation and loose wraps.  I doubt he would tolerate compression socks at this time.  Agree with PCP referral for home health to assist with this.  Continue furosemide 20 mg daily with recent labs showing stable renal function.  Not on ACE inhibitor, ARB, or ARNI/MRA/SGLT2 inhibitor in an effort to minimize hypotension and fall risk given  comorbid conditions and advanced age.  Not a candidate for invasive heart failure modalities.  CAD involving native coronary arteries without angina: He is without symptoms of angina or cardiac decompensation.  Not a candidate for invasive workup given advanced age and comorbid conditions.  In this setting, no indication for further cardiac testing.  Continue aggressive risk factor modification and current medical therapy including apixaban in place of aspirin, given underlying A-fib/flutter, along with Toprol-XL.  No longer on statin therapy at family request which is reasonable given his advanced age.  Persistent A-fib/flutter: Rate controlled with Toprol-XL.  CHA2DS2-VASc at least 6 (CHF, age x 2, CVA x 2, vascular disease).  Remains on apixaban 2.5 mg twice daily disease age and weight).  Recent labs stable.  No falls, though does note intermittent epistaxis.  Previously evaluated by ENT without cauterization.  Can use Afrin or Neo-Synephrine for nosebleeds with recommendation to follow-up with ENT.  Moving forward, we will need to weigh the risk versus benefit profile of ongoing DOAC use given comorbid conditions.  Mitral regurgitation: Trivial by most recent echo.  Prior echo in 07/2020 notable for moderate to severe mitral regurgitation.  Previously not felt to be a candidate for TEE or invasive valve repair given advanced age and comorbid conditions.  Aortic stenosis: Cannot exclude some degree of low-flow low gradient across the aortic valve with underlying cardiomyopathy.  No plans for invasive therapy given advanced age.  Atherosclerosis/HLD: LDL 62 in 02/2022.  No longer on statin therapy given advanced age.  Macrocytic anemia/thrombocytopenia: Hgb improved on recent check.  Will need periodic monitoring given underlying anemia and thrombocytopenia.    Disposition: F/u with Dr. Okey Dupre or an APP in 3 months.   Medication Adjustments/Labs and Tests Ordered: Current medicines are reviewed at  length with the patient today.  Concerns regarding medicines are outlined above. Medication changes, Labs and Tests ordered today are summarized above and listed in the Patient Instructions accessible in Encounters.   Signed, Eula Listen, PA-C 02/17/2023 4:27 PM     [facility] [place] [address] [place], [place] [address] [phone]

## 2023-02-17 ENCOUNTER — Encounter: Payer: Self-pay | Admitting: Physician Assistant

## 2023-02-17 ENCOUNTER — Ambulatory Visit: Payer: Medicare Other | Attending: Physician Assistant | Admitting: Physician Assistant

## 2023-02-17 VITALS — BP 116/73 | HR 72 | Wt 105.8 lb

## 2023-02-17 DIAGNOSIS — I350 Nonrheumatic aortic (valve) stenosis: Secondary | ICD-10-CM | POA: Diagnosis present

## 2023-02-17 DIAGNOSIS — I4819 Other persistent atrial fibrillation: Secondary | ICD-10-CM | POA: Diagnosis present

## 2023-02-17 DIAGNOSIS — I700 Atherosclerosis of aorta: Secondary | ICD-10-CM | POA: Insufficient documentation

## 2023-02-17 DIAGNOSIS — R040 Epistaxis: Secondary | ICD-10-CM | POA: Insufficient documentation

## 2023-02-17 DIAGNOSIS — E785 Hyperlipidemia, unspecified: Secondary | ICD-10-CM | POA: Diagnosis present

## 2023-02-17 DIAGNOSIS — D539 Nutritional anemia, unspecified: Secondary | ICD-10-CM | POA: Diagnosis present

## 2023-02-17 DIAGNOSIS — D696 Thrombocytopenia, unspecified: Secondary | ICD-10-CM | POA: Insufficient documentation

## 2023-02-17 DIAGNOSIS — M7989 Other specified soft tissue disorders: Secondary | ICD-10-CM | POA: Insufficient documentation

## 2023-02-17 DIAGNOSIS — I2510 Atherosclerotic heart disease of native coronary artery without angina pectoris: Secondary | ICD-10-CM | POA: Insufficient documentation

## 2023-02-17 DIAGNOSIS — I5020 Unspecified systolic (congestive) heart failure: Secondary | ICD-10-CM | POA: Insufficient documentation

## 2023-02-17 DIAGNOSIS — I340 Nonrheumatic mitral (valve) insufficiency: Secondary | ICD-10-CM | POA: Insufficient documentation

## 2023-02-17 NOTE — Patient Instructions (Signed)
Medication Instructions:  Your Physician recommend you continue on your current medication as directed.    *If you need a refill on your cardiac medications before your next appointment, please call your pharmacy*   Lab Work: None ordered   Follow-Up: At [facility], you and your health needs are our priority.  As part of our continuing mission to provide you with exceptional heart care, we have created designated Provider Care Teams.  These Care Teams include your primary Cardiologist (physician) and Advanced Practice Providers (APPs -  Physician Assistants and Nurse Practitioners) who all work together to provide you with the care you need, when you need it.  We recommend signing up for the patient portal called "MyChart".  Sign up information is provided on this After Visit Summary.  MyChart is used to connect with patients for Virtual Visits (Telemedicine).  Patients are able to view lab/test results, encounter notes, upcoming appointments, etc.  Non-urgent messages can be sent to your provider as well.   To learn more about what you can do with MyChart, go to [email or web address].    Your next appointment:   3 month(s)  Provider:   You may see Eula Listen, PA-C

## 2023-03-19 ENCOUNTER — Other Ambulatory Visit (HOSPITAL_BASED_OUTPATIENT_CLINIC_OR_DEPARTMENT_OTHER): Payer: Self-pay | Admitting: Physician Assistant

## 2023-03-20 NOTE — Telephone Encounter (Signed)
last visit: 02/17/23 with plan to Follow-up in 3 months. Next visit: 05/25/22

## 2023-05-13 DEATH — deceased

## 2023-05-26 ENCOUNTER — Ambulatory Visit: Payer: No Typology Code available for payment source | Admitting: Physician Assistant

## 2023-06-10 DEATH — deceased
